# Patient Record
Sex: Male | Born: 1990 | Race: Black or African American | Hispanic: No | Marital: Single | Smoking: Never smoker
Health system: Southern US, Community
[De-identification: ages and names within clinical notes are randomized; demographics above are authoritative.]

---
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
## 2014-10-17 ENCOUNTER — Emergency Department (HOSPITAL_COMMUNITY)
Admission: EM | Admit: 2014-10-17 | Discharge: 2014-10-17 | Disposition: A | Discharge status: Home / Self Care | Payer: Self-pay | Attending: Emergency Medicine | Admitting: Emergency Medicine

## 2014-10-17 ENCOUNTER — Encounter (HOSPITAL_COMMUNITY): Payer: Self-pay | Admitting: *Deleted

## 2014-10-17 DIAGNOSIS — L259 Unspecified contact dermatitis, unspecified cause: Secondary | ICD-10-CM | POA: Insufficient documentation

## 2014-10-17 DIAGNOSIS — L309 Dermatitis, unspecified: Secondary | ICD-10-CM

## 2014-10-17 MED ORDER — DIPHENHYDRAMINE HCL 25 MG PO TABS: 25.0000 mg | ORAL_TABLET | Freq: Four times a day (QID) | ORAL | Status: DC

## 2014-10-17 MED ORDER — PREDNISONE 20 MG PO TABS
60.0000 mg | ORAL_TABLET | Freq: Every day | ORAL | Status: DC
  Administered 2014-10-17: 60 mg via ORAL
  Filled 2014-10-17: qty 3

## 2014-10-17 MED ORDER — DIPHENHYDRAMINE HCL 25 MG PO CAPS
25.0000 mg | ORAL_CAPSULE | Freq: Once | ORAL | Status: AC
  Administered 2014-10-17: 25 mg via ORAL
  Filled 2014-10-17: qty 1

## 2014-10-17 MED ORDER — PREDNISONE 10 MG (21) PO TBPK: 10.0000 mg | ORAL_TABLET | Freq: Every day | ORAL | Status: DC

## 2014-10-17 MED ORDER — FAMOTIDINE 20 MG PO TABS
20.0000 mg | ORAL_TABLET | Freq: Once | ORAL | Status: AC
  Administered 2014-10-17: 20 mg via ORAL
  Filled 2014-10-17: qty 1

## 2014-10-17 NOTE — Discharge Instructions (Signed)

## 2014-10-17 NOTE — ED Provider Notes (Signed)
CSN: [id number]     Arrival date & time 10/17/14  1613 History  This chart was scribed for Phillip Berry, PA-C, working with Laurence Spates, MD by Chestine Spore, ED Scribe. The patient was seen in room TR10C/TR10C at 5:05 PM.    Chief Complaint  Patient presents with  . Rash      The history Flores provided by the patient. No language interpreter was used.    Phillip Mcelroy Flores a 24 y.o. male who presents to the Emergency Department complaining of worsening rash onset 4 days. Pt reports that his rash was initially just on the palm of his hand and it has spread to the soles of his feet, back, and upper chest. Pt states that he had a rash to his face that has since resolved since using the OTC anti-itch cream. Pt reports that the areas burn, itch, and sting and this morning there was pain with ambulation. Pt rates his rash pain as 5/10 and he has been able to use his hands. 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 Pt denies coming in contact with chemicals at his job. Pt thinks that he Flores having a reaction to either a detergent or medication. Pt denies new soaps/medications/pets/environment/lotions/detergent. He notes that he has tried OTC anti-itch cream without PO medications with mild relief of his symptoms. He denies wound, diaphoresis, fever, chills, trouble swallowing, SOB, lip/tongue swelling, sore throat, and any other symptoms. Pt Flores not allergic to any medications. Pt denies allergies to any medications. Pt denies having a PCP at this time. Pt denies a medical hx of diabetes.   History reviewed. No pertinent past medical history. History reviewed. No pertinent past surgical history. No family history on file. History  Substance Use Topics  . Smoking status: Never Smoker   . Smokeless tobacco: Not on file  . Alcohol Use: No    Review of Systems  Constitutional: Negative for fever, chills and  diaphoresis.  HENT: Negative for sore throat and trouble swallowing.   Skin: Positive for color change and rash. Negative for wound.      Allergies  Review of patient's allergies indicates no known allergies.  Home Medications   Prior to Admission medications   Not on File   BP 136/95 mmHg  Pulse 94  Temp(Src) 97.9 F (36.6 C) (Oral)  Resp 18  SpO2 96% Physical Exam  Constitutional: He Flores oriented to person, place, and time. He appears well-developed and well-nourished. No distress.  HENT:  Head: Normocephalic and atraumatic.  Nose: Nose normal.  Mouth/Throat: Oropharynx Flores clear and moist. No oropharyngeal exudate.  Eyes: Conjunctivae and EOM are normal. Pupils are equal, round, and reactive to light. Right eye exhibits no discharge. Left eye exhibits no discharge. No scleral icterus.  Neck: Normal range of motion. No JVD present. No tracheal deviation present. No thyromegaly present.  Cardiovascular: Normal rate, regular rhythm, normal heart sounds and intact distal pulses.  Exam reveals no gallop and no friction rub.   No murmur heard. Pulmonary/Chest: Effort normal and breath sounds normal. No respiratory distress. He has no wheezes. He has no rales. He exhibits no tenderness.  Abdominal: Soft. Bowel sounds are normal. He exhibits no distension and no mass. There Flores no tenderness. There Flores no rebound and no guarding.  Musculoskeletal: Normal range of motion. He exhibits no edema or tenderness.  Lymphadenopathy:    He  has no cervical adenopathy.  Neurological: He Flores alert and oriented to person, place, and time. He has normal reflexes. No cranial nerve deficit. He exhibits normal muscle tone. Coordination normal.  Skin: Skin Flores warm and dry. Rash noted. Rash Flores maculopapular. He Flores not diaphoretic. There Flores erythema. No pallor.  Erythematous maculopapular rash to the palms of hands, in-between fingers 1, 2, and 3 bilaterally to the plantar aspect of feet and diffusely on  chest and back.  Psychiatric: He has a normal mood and affect. His behavior Flores normal. Judgment and thought content normal.  Nursing note and vitals reviewed.   ED Course  Procedures (including critical care time) DIAGNOSTIC STUDIES: Oxygen Saturation Flores 96% on RA, nl by my interpretation.    COORDINATION OF CARE: 5:11 PM-Discussed treatment plan which includes benadryl, pepcid, prednisone, f/u if the symptoms worsen, referral to health and wellness center with pt at bedside and pt agreed to plan.   Labs Review Labs Reviewed - No data to display  Imaging Review No results found.   EKG Interpretation None      MDM   Final diagnoses:  None    Rash/allergic reaction - pt suspects it may be to pills his friend gave him or some exposure at work - rash to hands, feet, back and chest - erythematous macules/wheels, no respiratory distress PT given pepcid, benadryl and steroids - Rx of benadryl for itch and long steroid taper. Patient re-evaluated prior to dc, Flores hemodynamically stable, in no respiratory distress, and denies the feeling of throat closing. Pt has been advised to take OTC benadryl & return to the ED if they have a mod-severe allergic rxn (s/s including throat closing, difficulty breathing, swelling of lips face or tongue). Pt Flores to follow up with referall to Health and Wellness center. Pt Flores agreeable with plan & verbalizes understanding.   I personally performed the services described in this documentation, which was scribed in my presence. The recorded information has been reviewed and Flores accurate.    Phillip Berry, PA-C 10/25/14 0127  Laurence Spates, MD 10/27/14 [id number]

## 2014-10-17 NOTE — ED Notes (Signed)
Pt states that he has a reaction to some detergent or medication on Monday. Pt reports a rash at this time. Pt denies any other symptoms. NAD noted.

## 2014-10-22 ENCOUNTER — Emergency Department (HOSPITAL_COMMUNITY)
Admission: EM | Admit: 2014-10-22 | Discharge: 2014-10-22 | Disposition: A | Discharge status: Home / Self Care | Payer: Self-pay | Attending: Emergency Medicine | Admitting: Emergency Medicine

## 2014-10-22 ENCOUNTER — Encounter (HOSPITAL_COMMUNITY): Payer: Self-pay

## 2014-10-22 DIAGNOSIS — R21 Rash and other nonspecific skin eruption: Secondary | ICD-10-CM | POA: Insufficient documentation

## 2014-10-22 MED ORDER — HYDROCORTISONE 1 % EX CREA: 1.0000 "application " | TOPICAL_CREAM | Freq: Two times a day (BID) | CUTANEOUS | Status: DC

## 2014-10-22 NOTE — Discharge Instructions (Signed)
Take the prescribed medication as directed.  Continue taking prednisone as directed. Return to the ED for new or worsening symptoms.

## 2014-10-22 NOTE — ED Provider Notes (Signed)
CSN: [id number]     Arrival date & time 10/22/14  1359 History  This chart was scribed for non-physician practitioner, Sharilyn Sites, PA-C, working with Gwyneth Sprout, MD by Charline Bills, ED Scribe. This patient was seen in room TR07C/TR07C and the patient's care was started at 2:44 PM.   Chief Complaint  Patient presents with  . Medication Problem   The history is provided by the patient. No language interpreter was used.   HPI Comments: Phillip Flores is a 24 y.o. male who presents to the Emergency Department complaining of bilateral feet swelling since last night. Pt was seen in the ED on 10/17/14 for a rash and prescribed Prednisone. He states that he has taken Prednisone daily at 6 AM but forgot to take the medication until 10 PM last night. Patient states he noticed his feet were swelling prior to getting in bed last night. He states he does stand on his feet for long periods of day and walks around a lot. He denies any numbness or weakness of his feet. No worsening of rash. No shortness of breath, oral swelling, or difficulty swallowing.  History reviewed. No pertinent past medical history. History reviewed. No pertinent past surgical history. History reviewed. No pertinent family history. History  Substance Use Topics  . Smoking status: Never Smoker   . Smokeless tobacco: Not on file  . Alcohol Use: No    Review of Systems  Skin: Positive for rash.  All other systems reviewed and are negative.  Allergies  Review of patient's allergies indicates no known allergies.  Home Medications   Prior to Admission medications   Medication Sig Start Date End Date Taking? Authorizing Provider  diphenhydrAMINE (BENADRYL) 25 MG tablet Take 1 tablet (25 mg total) by mouth every 6 (six) hours. 10/17/14   Danelle Berry, PA-C  predniSONE (STERAPRED UNI-PAK 21 TAB) 10 MG (21) TBPK tablet Take 1 tablet (10 mg total) by mouth daily. Take 6 tabs by mouth daily  for 2 days, then 5 tabs for 2 days, then 4  tabs for 2 days, then 3 tabs for 2 days, 2 tabs for 2 days, then 1 tab by mouth daily for 2 days 10/17/14   Sheliah Mends Tapia, PA-C   BP 130/69 mmHg  Pulse 104  Temp(Src) 97.9 F (36.6 C) (Oral)  Resp 20  Ht 5\' 11"  (1.803 m)  Wt 264 lb 12.8 oz (120.112 kg)  BMI 36.95 kg/m2  SpO2 97% Physical Exam  Constitutional: He is oriented to person, place, and time. He appears well-developed and well-nourished. No distress.  HENT:  Head: Normocephalic and atraumatic.  Mouth/Throat: Oropharynx is clear and moist.  Eyes: Conjunctivae and EOM are normal. Pupils are equal, round, and reactive to light.  Neck: Normal range of motion. Neck supple.  Cardiovascular: Normal rate, regular rhythm and normal heart sounds.   Pulmonary/Chest: Effort normal and breath sounds normal. No respiratory distress. He has no wheezes.  Musculoskeletal: Normal range of motion.  Neurological: He is alert and oriented to person, place, and time.  Skin: Skin is warm and dry. He is not diaphoretic.  Appears to have mild skin irritation of bilateral arches of feet without overt rash at this time; no signs of superimposed infection or cellulitis; no open wounds or drainage; no foot swelling noted, patient ambulatory without difficulty  Psychiatric: He has a normal mood and affect.  Nursing note and vitals reviewed.  ED Course  Procedures (including critical care time) DIAGNOSTIC STUDIES: Oxygen Saturation is 97% on  RA, normal by my interpretation.    COORDINATION OF CARE: 2:48 PM-Discussed treatment plan which includes Hydrocortisone cream with pt at bedside and pt agreed to plan.   Labs Review Labs Reviewed - No data to display  Imaging Review No results found.   EKG Interpretation None      MDM   Final diagnoses:  Rash   24 year old male here with bilateral foot swelling. I do not believe this is related to his rash nor his use of prednisone, this is more likely dependent edema from walking around for 8+ hours at  work. There is no visible swelling of his feet at this time. He continues to have some mild irritation of bilateral arches of his feet without overt rash. No signs of superimposed infection or cellulitis. He is ambulatory without difficulty. Will add topical hydrocortisone cream to regimen. Patient to follow-up with PCP.  Discussed plan with patient, he/she acknowledged understanding and agreed with plan of care.  Return precautions given for new or worsening symptoms.  I personally performed the services described in this documentation, which was scribed in my presence. The recorded information has been reviewed and is accurate.  Garlon Hatchet, PA-C 10/22/14 1511  Gwyneth Sprout, MD 10/28/14 2117

## 2014-10-22 NOTE — ED Notes (Signed)
Pt reports onset 1 week ago had widespread swelling and rash.  Seen in ED was given steroids.  Rash and swelling resolved.  Pt has been taking the prednisone at 6am but yesterday did not take until 10pm.  Pt woke up this morning and had feet swelling that has resolved and took dose @ 4:30am.  No swallowing or respiratory difficulties.

## 2014-10-29 ENCOUNTER — Encounter (HOSPITAL_COMMUNITY): Payer: Self-pay | Admitting: *Deleted

## 2014-10-29 ENCOUNTER — Emergency Department (HOSPITAL_COMMUNITY)
Admission: EM | Admit: 2014-10-29 | Discharge: 2014-10-29 | Disposition: A | Discharge status: Home / Self Care | Payer: Self-pay | Attending: Emergency Medicine | Admitting: Emergency Medicine

## 2014-10-29 DIAGNOSIS — L509 Urticaria, unspecified: Secondary | ICD-10-CM

## 2014-10-29 DIAGNOSIS — L500 Allergic urticaria: Secondary | ICD-10-CM | POA: Insufficient documentation

## 2014-10-29 MED ORDER — PREDNISONE 10 MG (21) PO TBPK: 10.0000 mg | ORAL_TABLET | Freq: Every day | ORAL | Status: AC

## 2014-10-29 MED ORDER — DIPHENHYDRAMINE HCL 25 MG PO CAPS: 25.0000 mg | ORAL_CAPSULE | Freq: Three times a day (TID) | ORAL | Status: AC | PRN

## 2014-10-29 MED ORDER — DIPHENHYDRAMINE HCL 25 MG PO CAPS
25.0000 mg | ORAL_CAPSULE | Freq: Once | ORAL | Status: AC
  Administered 2014-10-29: 25 mg via ORAL
  Filled 2014-10-29: qty 1

## 2014-10-29 NOTE — ED Notes (Signed)
Called for triage x3. No answer 

## 2014-10-29 NOTE — ED Provider Notes (Signed)
CSN: [id number]     Arrival date & time 10/29/14  1829 History  This chart was scribed for Phillip Favor, NP working with Phillip Scott, MD by Placido Sou, ED Scribe. This patient was seen in room TR02C/TR02C and the patient's care was started at 7:59 PM.     Chief Complaint  Patient presents with  . Allergic Reaction   The history is provided by the patient. No language interpreter was used.   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 He initially notes being seen for his symptoms, was prescribed prednisone and hydrocortisone cream and further notes that it alleviated his symptoms partially before they worsened once again. 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 He denies fever, chills or any other associated symptoms.  He states prior to the onset of the rash 3 weeks ago.  He was not on any antibiotics, new medications or new  products, etc.  History reviewed. No pertinent past medical history. History reviewed. No pertinent past surgical history. No family history on file. Social History  Substance Use Topics  . Smoking status: Never Smoker   . Smokeless tobacco: Never Used  . Alcohol Use: No    Review of Systems  Constitutional: Negative for fever and chills.  HENT: Negative for trouble swallowing and voice change.   Respiratory: Negative for shortness of breath.   Gastrointestinal: Negative for nausea.  Skin: Positive for rash.  Neurological: Negative for dizziness and headaches.  All other systems reviewed and are negative.   Allergies  Review of patient's allergies indicates no known allergies.  Home Medications   Prior to Admission medications   Medication Sig Start Date End Date Taking? Authorizing Provider  diphenhydrAMINE (BENADRYL) 25 mg capsule Take 1 capsule (25 mg total) by  mouth every 8 (eight) hours as needed. 10/29/14   Phillip Favor, NP  predniSONE (STERAPRED UNI-PAK 21 TAB) 10 MG (21) TBPK tablet Take 1 tablet (10 mg total) by mouth daily. Take 6 tabs by mouth daily  for 2 days, then 5 tabs for 2 days, then 4 tabs for 2 days, then 3 tabs for 2 days, 2 tabs for 2 days, then 1 tab by mouth daily for 2 days 10/29/14   Phillip Favor, NP   BP 136/90 mmHg  Pulse 107  Temp(Src) 98.3 F (36.8 C) (Oral)  Resp 18  Ht  (1.803 m)  Wt 264 lb (119.75 kg)  BMI 36.84 kg/m2  SpO2 98% Physical Exam  Constitutional: He is oriented to person, place, and time. He appears well-developed and well-nourished. No distress.  HENT:  Head: Normocephalic and atraumatic.  Mouth/Throat: No oropharyngeal exudate.  Eyes: Right eye exhibits no discharge. Left eye exhibits no discharge.  Neck: Normal range of motion. No tracheal deviation present.  Cardiovascular: Normal rate.   Pulmonary/Chest: Effort normal. No respiratory distress.  Abdominal: Soft. There is no tenderness.  Musculoskeletal: Normal range of motion.  Neurological: He is alert and oriented to person, place, and time.  Skin: Skin is warm and dry. Rash noted. He is not diaphoretic.  Patient has an urticarial type rash that blanches with pressure.    Psychiatric: He has a normal mood and affect. His behavior is normal.  Nursing note and vitals reviewed.   ED Course  Procedures  DIAGNOSTIC STUDIES:  Oxygen Saturation is 98% on RA, normal by my interpretation.    COORDINATION OF CARE: 8:01 PM Discussed treatment plan with pt at bedside and pt agreed to plan.  Labs Review Labs Reviewed - No data to display  Imaging Review No results found. I have personally reviewed and evaluated these images and lab results as part of my medical decision-making.   EKG Interpretation None     Patient is not having any respiratory distress but has an occurrence of urticaria, idiopathic in nature.  He will be given another  short course of steroid-dependent and a referral to dermatologist MDM   Final diagnoses:  Urticaria     I personally performed the services described in this documentation, which was scribed in my presence. The recorded information has been reviewed and is accurate.  Phillip Favor, NP 10/29/14 8546  Phillip Scott, MD 10/29/14 2027

## 2014-10-29 NOTE — ED Notes (Signed)
Patient presents with red raised areas to his arms and neck and face.  States he finished up his meds today about noon and the rash started coming back  Was treated for the same  Works in a warehouse

## 2014-10-29 NOTE — ED Notes (Signed)
The pt was taken out of the computer ?? Placed back into the system

## 2018-05-19 ENCOUNTER — Emergency Department (HOSPITAL_COMMUNITY)
Admission: EM | Admit: 2018-05-19 | Discharge: 2018-05-19 | Disposition: A | Discharge status: Home / Self Care | Payer: Self-pay | Attending: Emergency Medicine | Admitting: Emergency Medicine

## 2018-05-19 ENCOUNTER — Encounter (HOSPITAL_COMMUNITY): Payer: Self-pay | Admitting: Emergency Medicine

## 2018-05-19 ENCOUNTER — Other Ambulatory Visit: Payer: Self-pay

## 2018-05-19 ENCOUNTER — Emergency Department (HOSPITAL_COMMUNITY): Payer: Self-pay

## 2018-05-19 DIAGNOSIS — R109 Unspecified abdominal pain: Secondary | ICD-10-CM

## 2018-05-19 DIAGNOSIS — N132 Hydronephrosis with renal and ureteral calculous obstruction: Secondary | ICD-10-CM

## 2018-05-19 DIAGNOSIS — N131 Hydronephrosis with ureteral stricture, not elsewhere classified: Secondary | ICD-10-CM | POA: Insufficient documentation

## 2018-05-19 LAB — CBC WITH DIFFERENTIAL/PLATELET
ABS IMMATURE GRANULOCYTES: 0.01 10*3/uL (ref 0.00–0.07)
Basophils Absolute: 0 10*3/uL (ref 0.0–0.1)
Basophils Relative: 0 %
Eosinophils Absolute: 0.1 10*3/uL (ref 0.0–0.5)
Eosinophils Relative: 2 %
HEMATOCRIT: 47.2 % (ref 39.0–52.0)
Hemoglobin: 15.9 g/dL (ref 13.0–17.0)
IMMATURE GRANULOCYTES: 0 %
LYMPHS ABS: 2.5 10*3/uL (ref 0.7–4.0)
Lymphocytes Relative: 37 %
MCH: 29.8 pg (ref 26.0–34.0)
MCHC: 33.7 g/dL (ref 30.0–36.0)
MCV: 88.6 fL (ref 80.0–100.0)
MONO ABS: 0.5 10*3/uL (ref 0.1–1.0)
MONOS PCT: 8 %
NEUTROS ABS: 3.5 10*3/uL (ref 1.7–7.7)
NEUTROS PCT: 53 %
Platelets: 309 10*3/uL (ref 150–400)
RBC: 5.33 MIL/uL (ref 4.22–5.81)
RDW: 12.8 % (ref 11.5–15.5)
WBC: 6.6 10*3/uL (ref 4.0–10.5)
nRBC: 0 % (ref 0.0–0.2)

## 2018-05-19 LAB — URINALYSIS, ROUTINE W REFLEX MICROSCOPIC
Bilirubin Urine: NEGATIVE
GLUCOSE, UA: NEGATIVE mg/dL
Ketones, ur: NEGATIVE mg/dL
Leukocytes,Ua: NEGATIVE
Nitrite: NEGATIVE
Protein, ur: NEGATIVE mg/dL
SPECIFIC GRAVITY, URINE: 1.013 (ref 1.005–1.030)
pH: 7 (ref 5.0–8.0)

## 2018-05-19 LAB — BASIC METABOLIC PANEL
ANION GAP: 10 (ref 5–15)
BUN: 12 mg/dL (ref 6–20)
CALCIUM: 9.9 mg/dL (ref 8.9–10.3)
CO2: 24 mmol/L (ref 22–32)
Chloride: 107 mmol/L (ref 98–111)
Creatinine, Ser: 1.22 mg/dL (ref 0.61–1.24)
GFR calc Af Amer: >60 mL/min (ref >60)
GFR calc non Af Amer: >60 mL/min (ref >60)
GLUCOSE: 100 mg/dL — AB (ref 70–99)
Potassium: 3.9 mmol/L (ref 3.5–5.1)
Sodium: 141 mmol/L (ref 135–145)

## 2018-05-19 MED ORDER — ONDANSETRON 4 MG PO TBDP: ORAL_TABLET | ORAL | 0 refills | Status: DC

## 2018-05-19 MED ORDER — HYDROCODONE-ACETAMINOPHEN 5-325 MG PO TABS: 1.0000 | ORAL_TABLET | Freq: Four times a day (QID) | ORAL | 0 refills | Status: DC | PRN

## 2018-05-19 MED ORDER — ACETAMINOPHEN 325 MG PO TABS
650.0000 mg | ORAL_TABLET | Freq: Once | ORAL | Status: AC
  Administered 2018-05-19: 650 mg via ORAL
  Filled 2018-05-19: qty 2

## 2018-05-19 MED ORDER — TAMSULOSIN HCL 0.4 MG PO CAPS: 0.4000 mg | ORAL_CAPSULE | Freq: Every day | ORAL | 0 refills | Status: DC

## 2018-05-19 MED ORDER — IBUPROFEN 600 MG PO TABS: 600.0000 mg | ORAL_TABLET | Freq: Four times a day (QID) | ORAL | 0 refills | Status: AC | PRN

## 2018-05-19 NOTE — ED Triage Notes (Signed)
Patient complains of lower left abdominal pain. This pain was present a few weeks ago for a few hours but the pain went away. The pain started at 1000 and has been constant.

## 2018-05-19 NOTE — ED Provider Notes (Signed)
[facility] EMERGENCY DEPARTMENT Provider Note   CSN: [id number] Arrival date & time: 05/19/18  1704    History   Chief Complaint Chief Complaint  Patient presents with  . Abdominal Pain    HPI Phillip Flores is a 28 y.o. male.     The history is provided by the patient. No language interpreter was used.  Abdominal Pain   Phillip Flores is a 28 y.o. male who presents to the Emergency Department complaining of abdominal pain. He presents to the emergency department complaining of abdominal pain. He had a pain episode about two weeks ago in the left lower quadrant that resolved without intervention. Today around 6 AM he developed recurrent left lower quadrant pain that initially was 8/10 in severity. Overall the pain has significantly improved without intervention. It is dull in nature and nonradiating. He denies any fevers, nausea, vomiting, dysuria, hematuria, diarrhea, constipation, testicular/groin swelling or pain. He works as a Hospital doctor for [employer]. He denies any recent injuries. No prior similar symptoms. History reviewed. No pertinent past medical history.  There are no active problems to display for this patient.   History reviewed. No pertinent surgical history.      Home Medications    Prior to Admission medications   Medication Sig Start Date End Date Taking? Authorizing Provider  pseudoephedrine (SUDAFED) 30 MG tablet Take 30 mg by mouth every 8 (eight) hours as needed for congestion.   Yes [provider]  diphenhydrAMINE (BENADRYL) 25 mg capsule Take 1 capsule (25 mg total) by mouth every 8 (eight) hours as needed. Patient not taking: Reported on 05/19/2018 10/29/14   Earley Favor, NP  predniSONE (STERAPRED UNI-PAK 21 TAB) 10 MG (21) TBPK tablet Take 1 tablet (10 mg total) by mouth daily. Take 6 tabs by mouth daily  for 2 days, then 5 tabs for 2 days, then 4 tabs for 2 days, then 3 tabs for 2 days, 2 tabs for 2 days, then 1 tab by mouth daily for 2  days Patient not taking: Reported on 05/19/2018 10/29/14   Earley Favor, NP    Family History History reviewed. No pertinent family history.  Social History Social History   Tobacco Use  . Smoking status: Never Smoker  . Smokeless tobacco: Never Used  Substance Use Topics  . Alcohol use: No  . Drug use: No     Allergies   Patient has no known allergies.   Review of Systems Review of Systems  Gastrointestinal: Positive for abdominal pain.  All other systems reviewed and are negative.    Physical Exam Updated Vital Signs BP 135/78 (BP Location: Right Arm)   Pulse 73   Temp 98.4 F (36.9 C) (Oral)   Resp 16   Ht 5\' 10"  (1.778 m)   Wt 113.4 kg   SpO2 96%   BMI 35.87 kg/m   Physical Exam Vitals signs and nursing note reviewed.  Constitutional:      Appearance: He is well-developed.  HENT:     Head: Normocephalic and atraumatic.  Cardiovascular:     Rate and Rhythm: Normal rate and regular rhythm.     Heart sounds: No murmur.  Pulmonary:     Effort: Pulmonary effort is normal. No respiratory distress.     Breath sounds: Normal breath sounds.  Abdominal:     Palpations: Abdomen is soft.     Tenderness: There is no abdominal tenderness. There is no guarding or rebound.  Musculoskeletal:  General: No tenderness.  Skin:    General: Skin is warm and dry.  Neurological:     Mental Status: He is alert and oriented to person, place, and time.  Psychiatric:        Behavior: Behavior normal.      ED Treatments / Results  Labs (all labs ordered are listed, but only abnormal results are displayed) Labs Reviewed  URINALYSIS, ROUTINE W REFLEX MICROSCOPIC    EKG None  Radiology No results found.  Procedures Procedures (including critical care time)  Medications Ordered in ED Medications  acetaminophen (TYLENOL) tablet 650 mg (has no administration in time range)     Initial Impression / Assessment and Plan / ED Course  I have reviewed the  triage vital signs and the nursing notes.  Pertinent labs & imaging results that were available during my care of the patient were reviewed by me and considered in my medical decision making (see chart for details).        Patient here for evaluation of atraumatic left lower quadrant pain. He has no significant tenderness or masses on examination. UA was significant hematuria, plan to obtain BMP as well as CT scan studies to rule out obstructing stone. Patient care transferred pending CT. Final Clinical Impressions(s) / ED Diagnoses   Final diagnoses:  None    ED Discharge Orders    None       Tilden Fossa, MD 05/19/18 Silva Bandy

## 2018-05-19 NOTE — ED Notes (Signed)
Patient transported to CT 

## 2018-05-19 NOTE — ED Provider Notes (Signed)
Care assumed from Dr. Madilyn Hook, please see her note for full details, but in brief Phillip Flores is a 28 y.o. male who presents for evaluation of left lower quadrant pain.  Pain well managed here in the ED with Tylenol.  Blood noted in patient's urine and presentation concerning for kidney stone.  At shift change, CBC, BMP and CT renal stone study pending.  Plan: Follow-up on CT and labs, likely kidney stone, no signs of infection on urine.  Patient can likely be discharged home with symptom management.  Labs Reviewed  URINALYSIS, ROUTINE W REFLEX MICROSCOPIC - Abnormal; Notable for the following components:      Result Value   APPearance CLOUDY (*)    Hgb urine dipstick MODERATE (*)    Bacteria, UA RARE (*)    All other components within normal limits  BASIC METABOLIC PANEL - Abnormal; Notable for the following components:   Glucose, Bld 100 (*)    All other components within normal limits  CBC WITH DIFFERENTIAL/PLATELET   Ct Renal Stone Study  Result Date: 05/19/2018 CLINICAL DATA:  Intermittent left flank pain today and 1 week ago. EXAM: CT ABDOMEN AND PELVIS WITHOUT CONTRAST TECHNIQUE: Multidetector CT imaging of the abdomen and pelvis was performed following the standard protocol without IV contrast. COMPARISON:  None. FINDINGS: Lower chest: The lung bases are clear without focal nodule, mass, or airspace disease. The heart size is normal. Hepatobiliary: No focal liver abnormality is seen. No gallstones, gallbladder wall thickening, or biliary dilatation. Pancreas: Unremarkable. No pancreatic ductal dilatation or surrounding inflammatory changes. Spleen: Normal in size without focal abnormality. Adrenals/Urinary Tract: Adrenal glands are within normal limits bilaterally. Right kidney and ureter is normal. Mild left sided hydronephrosis is present. A punctate nonobstructing stone is present at the lower pole of the left kidney. The left ureter is dilated to an obstructing 3 mm stone at the left UVJ.  The urinary bladder is otherwise normal. Stomach/Bowel: The stomach is mildly distended. No obstructing lesion is present. The duodenum and small bowel are normal. Terminal ileum is within normal limits. The appendix is visualized and normal. The ascending and transverse colon are within normal limits. The descending and sigmoid colon are normal. Vascular/Lymphatic: No significant vascular findings are present. No enlarged abdominal or pelvic lymph nodes. Reproductive: Prostate is unremarkable. Other: No abdominal wall hernia or abnormality. No abdominopelvic ascites. Musculoskeletal: Vertebral body heights and alignment are maintained. Pelvis is normal. Hips are located and normal bilaterally. IMPRESSION: 1. Obstructing 3 mm stone in the distal left ureter at the UVJ with mild left-sided hydronephrosis. 2. Punctate nonobstructing stone at the lower pole of the left kidney. Electronically Signed   By: Marin Roberts M.D.   On: 05/19/2018 19:39   MDM  Labs showed normal kidney function, no acute electrolyte derangements, no leukocytosis and normal hemoglobin.  CT shows a 3 mm obstructing stone at the left UVJ with mild left-sided hydro-.  There is a punctate nonobstructing stone in the left lower pole of the kidney.  Discussed these results with patient, his pain is been well controlled here in the emergency department, will discharge home with pain medication, Zofran and Flomax, he was provided a urine strainer, urology follow-up if stone not passing her symptoms worsen.  Return precautions discussed.  Patient and mom expressed understanding and are in agreement with plan.   Legrand Rams 05/19/18 2022    Tilden Fossa, MD 05/20/18 [phone]

## 2018-05-19 NOTE — Discharge Instructions (Addendum)
Take ibuprofen if pain not managed with this you may use Norco.  Zofran as needed for nausea.  Flomax daily to help ease passing of kidney stone.  Follow-up with urology if stone is not passing.  Return if you have worsening flank pain, persistent vomiting, fevers or any other new or concerning symptoms.

## 2018-05-19 NOTE — ED Notes (Signed)
Patient verbalized understanding of discharge instructions. Opportunities for questioning and answers were provided. Armband removed by staff. Patient removed from ED.   

## 2019-11-26 ENCOUNTER — Encounter (HOSPITAL_COMMUNITY): Payer: Self-pay | Admitting: Emergency Medicine

## 2019-11-26 ENCOUNTER — Emergency Department (HOSPITAL_COMMUNITY)
Admission: EM | Admit: 2019-11-26 | Discharge: 2019-11-26 | Disposition: A | Discharge status: Home / Self Care | Payer: Self-pay | Attending: Emergency Medicine | Admitting: Emergency Medicine

## 2019-11-26 DIAGNOSIS — Z20822 Contact with and (suspected) exposure to covid-19: Secondary | ICD-10-CM | POA: Insufficient documentation

## 2019-11-26 DIAGNOSIS — R509 Fever, unspecified: Secondary | ICD-10-CM | POA: Insufficient documentation

## 2019-11-26 LAB — SARS CORONAVIRUS 2 BY RT PCR (HOSPITAL ORDER, PERFORMED IN ~~LOC~~ HOSPITAL LAB): SARS Coronavirus 2: NEGATIVE

## 2019-11-26 NOTE — Discharge Instructions (Signed)
You were seen in the emergency department today with fever 3 days ago.  I am testing you for COVID-19 and you should remain in quarantine until your test results come back later today.  If your Covid test is negative then you can return to work.  You should activate your MyChart account to follow the test results which will be back later this afternoon.

## 2019-11-26 NOTE — ED Provider Notes (Signed)
Emergency Department Provider Note   I have reviewed the triage vital signs and the nursing notes.   HISTORY  Chief Complaint work note   HPI Phillip Flores is a 28 y.o. male presents to the emergency department for evaluation for return to work.  He developed a fever on Friday, now 3 days ago.  He states that the time is feeling very fatigued and subjectively warm so took his temp and it was greater than 100 F on his thermometer.  He was not experiencing cough or congestion.  No sore throat.  No diarrhea.  He was not having body aches, vomiting.  Denies any chest or abdominal pain.  No sick contacts.  He has had his first Covid vaccine but that was approximately 1 week prior to his fever.  He is scheduled for his second vaccination. No radiation of symptoms or modifying factors.   History reviewed. No pertinent past medical history.  There are no problems to display for this patient.   No past surgical history on file.  Allergies Patient has no known allergies.  No family history on file.  Social History Social History   Tobacco Use  . Smoking status: Never Smoker  . Smokeless tobacco: Never Used  Substance Use Topics  . Alcohol use: No  . Drug use: No    Review of Systems  Constitutional: Positive fever on Friday (resolved).  ENT: No sore throat. Cardiovascular: Denies chest pain. Respiratory: Denies shortness of breath. Gastrointestinal: No abdominal pain.  No nausea, no vomiting.  No diarrhea.  No constipation. Musculoskeletal: Negative for back pain. Skin: Negative for rash. Neurological: Negative for headaches, focal weakness or numbness.  10-point ROS otherwise negative.  ____________________________________________   PHYSICAL EXAM:  VITAL SIGNS: ED Triage Vitals [11/26/19 1434]  Enc Vitals Group     BP (!) 144/83     Pulse Rate 68     Resp 18     Temp 98.5 F (36.9 C)     Temp Source Oral     SpO2 98 %     Weight 250 lb (113.4 kg)      Height 5\' 10"  (1.778 m)    Constitutional: Alert and oriented. Well appearing and in no acute distress. Eyes: Conjunctivae are normal.  Head: Atraumatic. Nose: No congestion/rhinnorhea. Mouth/Throat: Mucous membranes are moist.   Neck: No stridor.   Cardiovascular: Normal rate, regular rhythm. Good peripheral circulation. Grossly normal heart sounds.   Respiratory: Normal respiratory effort.  No retractions. Lungs CTAB. Gastrointestinal: No distention.  Musculoskeletal: No gross deformities of extremities. Neurologic:  Normal speech and language. Skin:  Skin is warm, dry and intact. No rash noted.  ____________________________________________   LABS (all labs ordered are listed, but only abnormal results are displayed)  Labs Reviewed  SARS CORONAVIRUS 2 BY RT PCR (HOSPITAL ORDER, PERFORMED IN [facility])   ____________________________________________   PROCEDURES  Procedure(s) performed:   Procedures  None  ____________________________________________   INITIAL IMPRESSION / ASSESSMENT AND PLAN / ED COURSE  Pertinent labs & imaging results that were available during my care of the patient were reviewed by me and considered in my medical decision making (see chart for details).    Patient presents to the emergency department for evaluation for return to work.  He has been without symptoms since Friday when he last had a fever.  His exam is unremarkable.  Plan for Covid PCR here.  Patient has downloaded to the MyChart app and will follow the  test results there.  If negative, he is cleared to return to work and I will provide a note to this effect.    ____________________________________________  FINAL CLINICAL IMPRESSION(S) / ED DIAGNOSES  Final diagnoses:  Fever, unspecified fever cause    Note:  This document was prepared using Dragon voice recognition software and may include unintentional dictation errors.  Alona Bene, MD, [facility]  [facility]    [name], Arlyss Repress, MD 11/26/19 [phone]

## 2019-11-26 NOTE — ED Triage Notes (Signed)
Pt states Friday he had a fever, pt states since he has not had any fevers no complaints, states his work told him he needs a note to come back.

## 2020-02-21 IMAGING — CT CT RENAL STONE PROTOCOL
2 of 4 series · 16 of 46 positions shown, 18 images · non-contrast
Comparison: None.

CLINICAL DATA: Intermittent left flank pain today and 1 week ago.

EXAM:
CT ABDOMEN AND PELVIS WITHOUT CONTRAST
TECHNIQUE: Multidetector CT imaging of the abdomen and pelvis was performed
following the standard protocol without IV contrast.

[Series 4: renal stone 5.0 · axial · 0.77mm/px · z∈[-151,+309]mm · 13 of 101 slices shown, 15 images]
[im 5/101  soft-tissue]
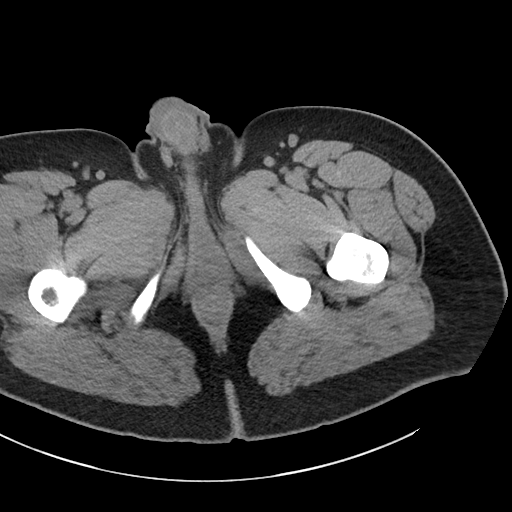
[im 5/101  bone]
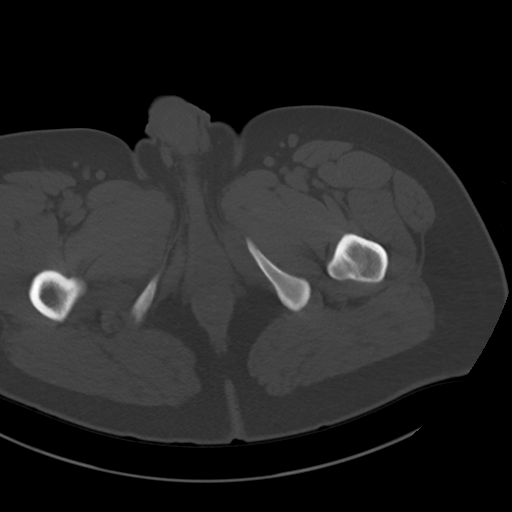
[im 13/101  soft-tissue]
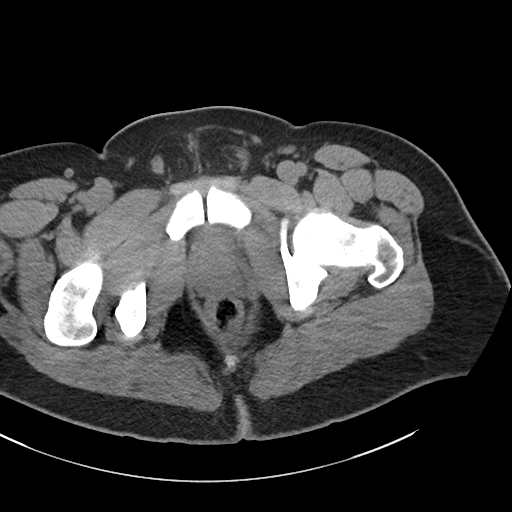
[im 21/101  soft-tissue]
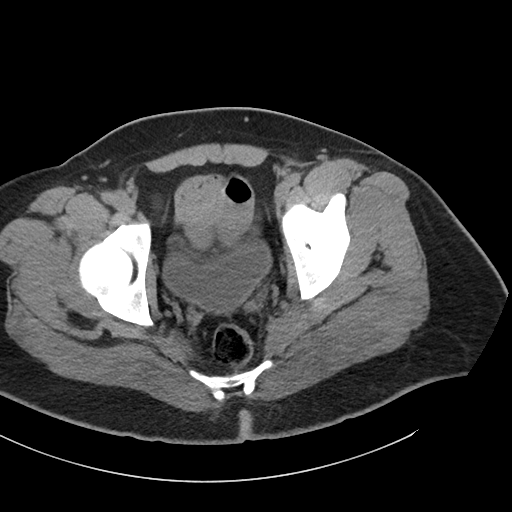
[im 29/101  soft-tissue]
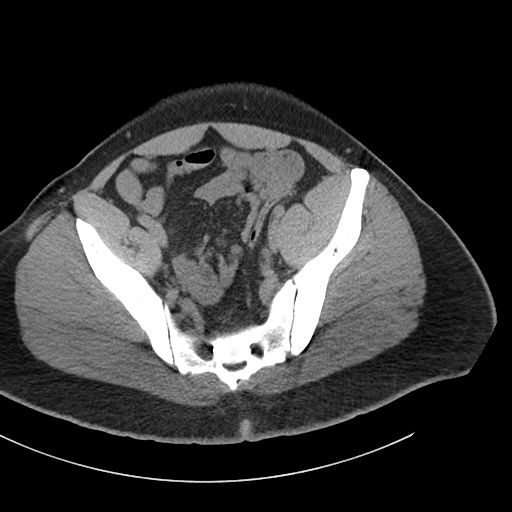
[im 37/101  soft-tissue]
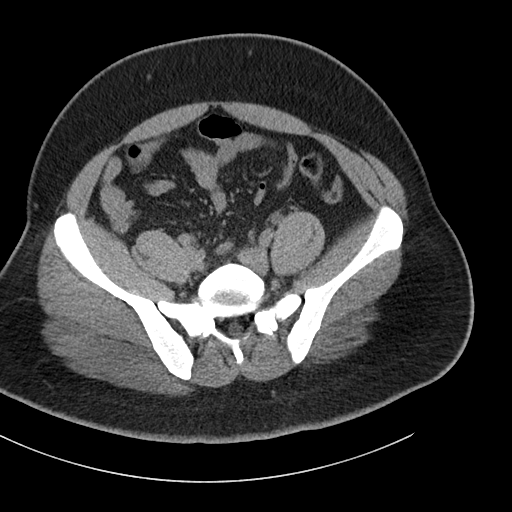
[im 45/101  soft-tissue]
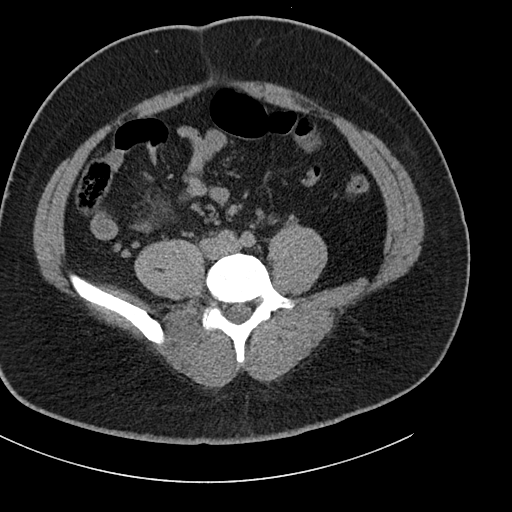
[im 53/101  soft-tissue]
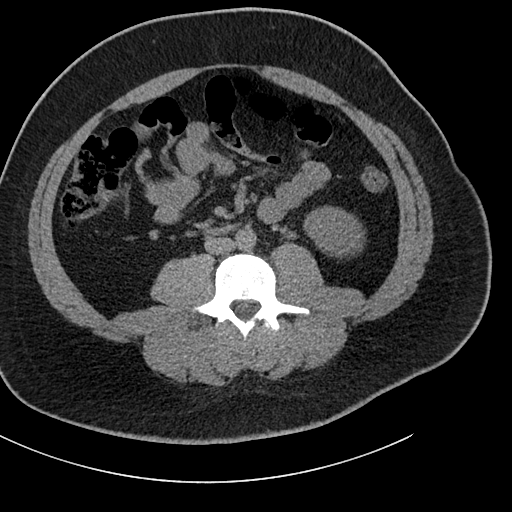
[im 57/101  soft-tissue]
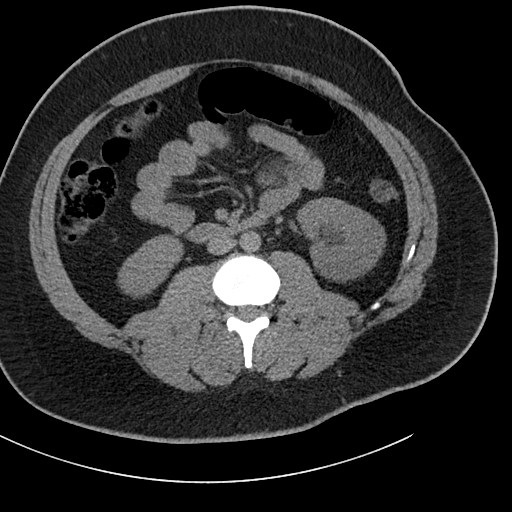
[im 65/101  soft-tissue]
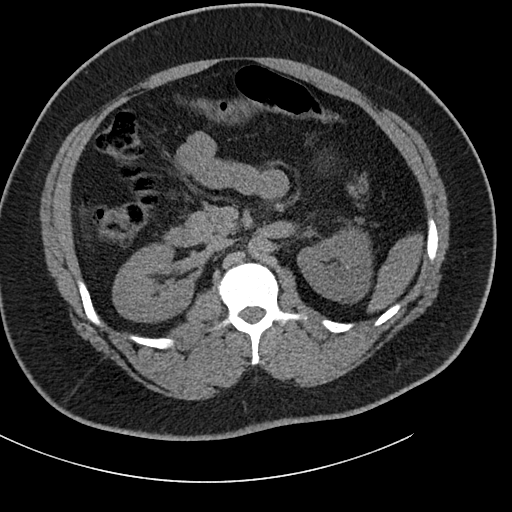
[im 65/101  bone]
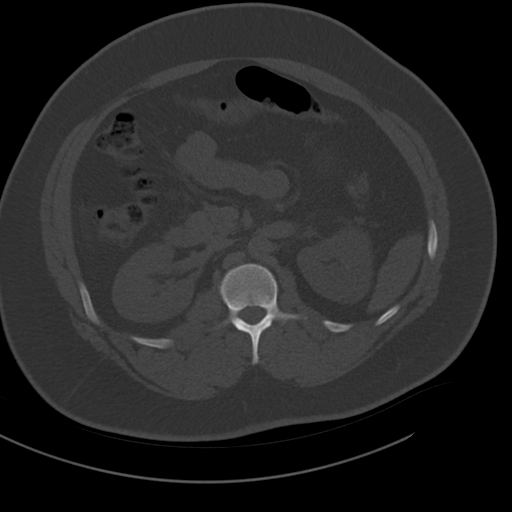
[im 73/101  soft-tissue]
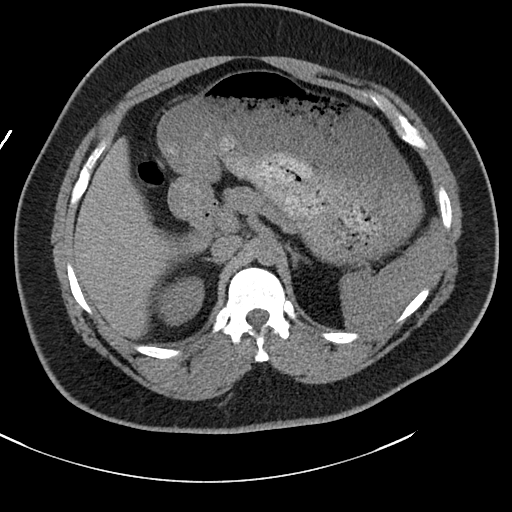
[im 81/101  soft-tissue]
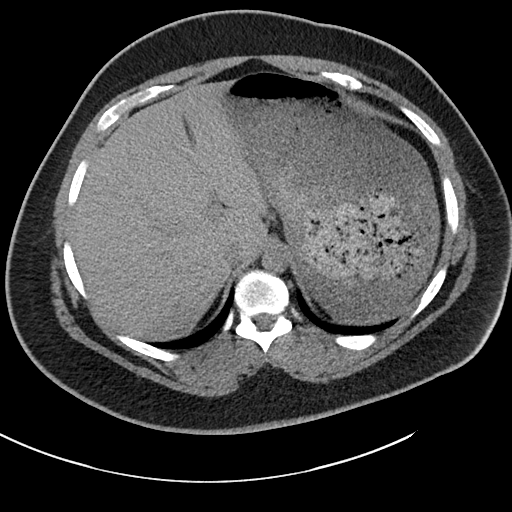
[im 89/101  soft-tissue]
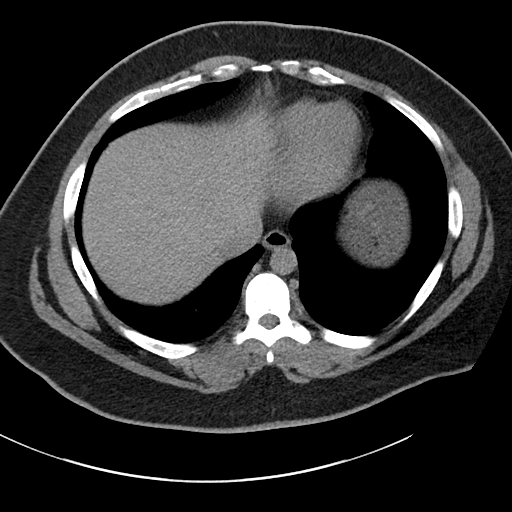
[im 97/101  soft-tissue]
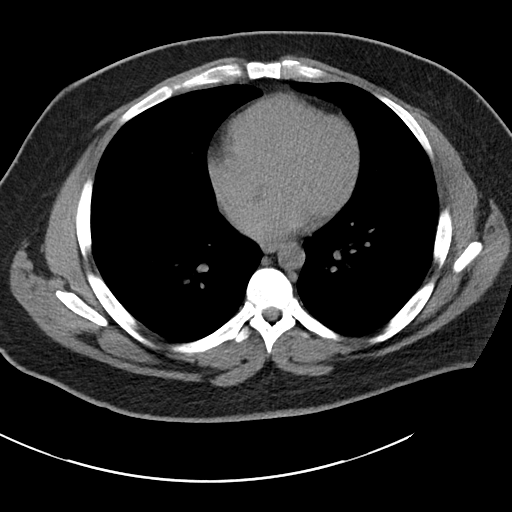

[Series 7: renal stone 3.0 cor · coronal · 0.93mm/px · 3 of 117 slices shown]
[im 39/117  soft-tissue]
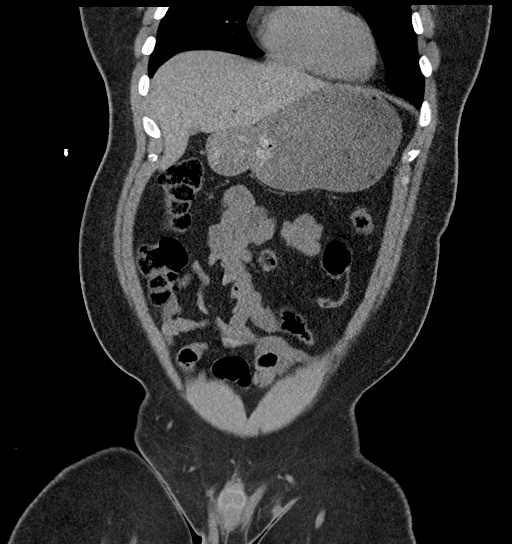
[im 52/117  soft-tissue]
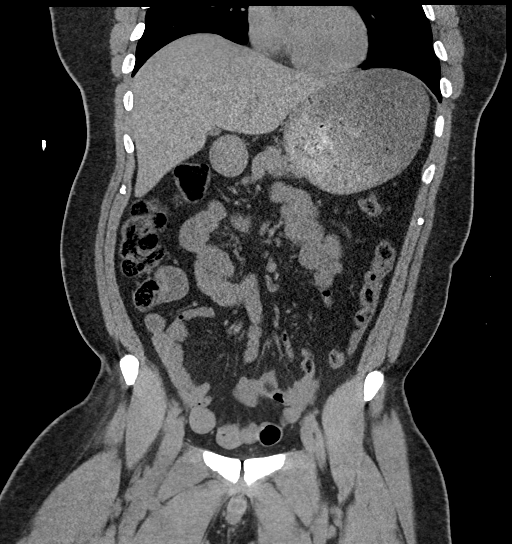
[im 65/117  soft-tissue]
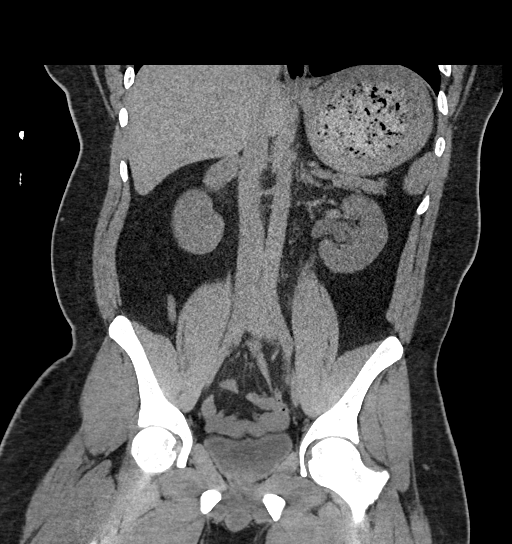

[16 of 46 positions shown; findings below may reference images not displayed]

FINDINGS: Lower chest: The lung bases are clear without focal nodule, mass, or
airspace disease. The heart size is normal.

Hepatobiliary: No focal liver abnormality is seen. No gallstones,
gallbladder wall thickening, or biliary dilatation.

Pancreas: Unremarkable. No pancreatic ductal dilatation or
surrounding inflammatory changes.

Spleen: Normal in size without focal abnormality.

Adrenals/Urinary Tract: Adrenal glands are within normal limits
bilaterally. Right kidney and ureter is normal. Mild left sided
hydronephrosis is present. A punctate nonobstructing stone is
present at the lower pole of the left kidney. The left ureter is
dilated to an obstructing 3 mm stone at the left UVJ. The urinary
bladder is otherwise normal.

Stomach/Bowel: The stomach is mildly distended. No obstructing
lesion is present. The duodenum and small bowel are normal. Terminal
ileum is within normal limits. The appendix is visualized and
normal. The ascending and transverse colon are within normal limits.
The descending and sigmoid colon are normal.

Vascular/Lymphatic: No significant vascular findings are present. No
enlarged abdominal or pelvic lymph nodes.

Reproductive: Prostate is unremarkable.

Other: No abdominal wall hernia or abnormality. No abdominopelvic
ascites.

Musculoskeletal: Vertebral body heights and alignment are
maintained. Pelvis is normal. Hips are located and normal
bilaterally.
IMPRESSION: 1. Obstructing 3 mm stone in the distal left ureter at the UVJ with
mild left-sided hydronephrosis.
2. Punctate nonobstructing stone at the lower pole of the left
kidney.

## 2020-10-24 ENCOUNTER — Encounter (HOSPITAL_COMMUNITY): Payer: Self-pay | Admitting: Emergency Medicine

## 2020-10-24 ENCOUNTER — Emergency Department (HOSPITAL_COMMUNITY)
Admission: EM | Admit: 2020-10-24 | Discharge: 2020-10-24 | Disposition: A | Discharge status: Home / Self Care | Payer: Self-pay | Attending: Emergency Medicine | Admitting: Emergency Medicine

## 2020-10-24 DIAGNOSIS — B349 Viral infection, unspecified: Secondary | ICD-10-CM | POA: Insufficient documentation

## 2020-10-24 DIAGNOSIS — U071 COVID-19: Secondary | ICD-10-CM | POA: Insufficient documentation

## 2020-10-24 LAB — RESP PANEL BY RT-PCR (FLU A&B, COVID) ARPGX2
Influenza A by PCR: NEGATIVE
Influenza B by PCR: NEGATIVE
SARS Coronavirus 2 by RT PCR: POSITIVE — AB

## 2020-10-24 MED ORDER — ACETAMINOPHEN 325 MG PO TABS
650.0000 mg | ORAL_TABLET | Freq: Once | ORAL | Status: AC
  Administered 2020-10-24: 650 mg via ORAL
  Filled 2020-10-24: qty 2

## 2020-10-24 NOTE — ED Triage Notes (Signed)
Pt here from home with c/o /h/a fever over the last 2 days , no cough or sob

## 2020-10-24 NOTE — Discharge Instructions (Signed)
Please check my chart for test results If COVID-positive, quarantine and you may return to work on Wednesday, August 17 as long as your symptoms are resolving and you no longer have a fever Use Tylenol and alternate with ibuprofen for fever, body aches, and others symptoms Return if you are worsening and are unable to tolerate liquids or have new or worsening symptoms

## 2020-10-24 NOTE — ED Provider Notes (Signed)
[facility] EMERGENCY DEPARTMENT Provider Note   CSN: [id number] Arrival date & time: 10/24/20  4580     History No chief complaint on file.   Blaize Epple is a 30 y.o. male.  HPI 30 year old male presents today complaining of fever, nasal congestion, headache.  Symptoms began on Wednesday with subjective fever.  He has been using OTC cold and fever medications.  He denies any vision changes, stiff neck, difficulty breathing, nausea, vomiting, or diarrhea.  He has no known exposures to COVID.  He has been vaccinated but has not received booster.     No past medical history on file.  There are no problems to display for this patient.   No past surgical history on file.     No family history on file.  Social History   Tobacco Use   Smoking status: Never   Smokeless tobacco: Never  Substance Use Topics   Alcohol use: No   Drug use: No    Home Medications Prior to Admission medications   Medication Sig Start Date End Date Taking? Authorizing Provider  diphenhydrAMINE (BENADRYL) 25 mg capsule Take 1 capsule (25 mg total) by mouth every 8 (eight) hours as needed. Patient not taking: Reported on 05/19/2018 10/29/14   Earley Favor, NP  HYDROcodone-acetaminophen [facility]) 5-325 MG tablet Take 1 tablet by mouth every 6 (six) hours as needed. 05/19/18   Dartha Lodge, PA-C  ibuprofen (ADVIL,MOTRIN) 600 MG tablet Take 1 tablet (600 mg total) by mouth every 6 (six) hours as needed. 05/19/18   Dartha Lodge, PA-C  ondansetron (ZOFRAN ODT) 4 MG disintegrating tablet 4mg  ODT q4 hours prn nausea/vomit 05/19/18   07/19/18, PA-C  predniSONE (STERAPRED UNI-PAK 21 TAB) 10 MG (21) TBPK tablet Take 1 tablet (10 mg total) by mouth daily. Take 6 tabs by mouth daily  for 2 days, then 5 tabs for 2 days, then 4 tabs for 2 days, then 3 tabs for 2 days, 2 tabs for 2 days, then 1 tab by mouth daily for 2 days Patient not taking: Reported on 05/19/2018 10/29/14   10/31/14, NP   pseudoephedrine (SUDAFED) 30 MG tablet Take 30 mg by mouth every 8 (eight) hours as needed for congestion.    [provider]  tamsulosin (FLOMAX) 0.4 MG CAPS capsule Take 1 capsule (0.4 mg total) by mouth daily. 05/19/18   07/19/18, PA-C    Allergies    Patient has no known allergies.  Review of Systems   Review of Systems  All other systems reviewed and are negative.  Physical Exam Updated Vital Signs BP 135/84 (BP Location: Right Arm)   Pulse 95   Temp (!) 102.4 F (39.1 C)   Resp 16   SpO2 96%   Physical Exam Vitals and nursing note reviewed.  Constitutional:      Appearance: He is well-developed.  HENT:     Head: Normocephalic and atraumatic.     Right Ear: External ear normal.     Left Ear: External ear normal.     Nose: Nose normal.     Mouth/Throat:     Mouth: Mucous membranes are moist.     Pharynx: Oropharynx is clear.  Eyes:     Extraocular Movements: Extraocular movements intact.  Neck:     Trachea: No tracheal deviation.  Cardiovascular:     Rate and Rhythm: Normal rate and regular rhythm.  Pulmonary:     Effort: Pulmonary effort is normal.  Abdominal:     General: Bowel sounds are normal.     Palpations: Abdomen is soft.  Musculoskeletal:        General: Normal range of motion.     Cervical back: Normal range of motion and neck supple.  Skin:    General: Skin is warm and dry.  Neurological:     Mental Status: He is alert and oriented to person, place, and time.  Psychiatric:        Mood and Affect: Mood normal.        Behavior: Behavior normal.    ED Results / Procedures / Treatments   Labs (all labs ordered are listed, but only abnormal results are displayed) Labs Reviewed  RESP PANEL BY RT-PCR (FLU A&B, COVID) [id number]    EKG None  Radiology No results found.  Procedures Procedures   Medications Ordered in ED Medications  acetaminophen (TYLENOL) tablet 650 mg (has no administration in time range)    ED Course  I  have reviewed the triage vital signs and the nursing notes.  Pertinent labs & imaging results that were available during my care of the patient were reviewed by me and considered in my medical decision making (see chart for details).    MDM Rules/Calculators/A&P                           Well-developed well-nourished male otherwise healthy presents today with viral syndrome.  COVID test has been ordered.  Discussed return precautions and need for follow-up as well as length of time patient will need to remain out of work if COVID is positive.  Discussed symptomatic care.  Patient voices understanding. Final Clinical Impression(s) / ED Diagnoses Final diagnoses:  Viral syndrome    Rx / DC Orders ED Discharge Orders     None        Margarita Grizzle, MD 10/24/20 [phone]

## 2021-01-28 ENCOUNTER — Emergency Department (HOSPITAL_COMMUNITY)
Admission: EM | Admit: 2021-01-28 | Discharge: 2021-01-28 | Disposition: A | Discharge status: Home / Self Care | Payer: Self-pay | Attending: Student | Admitting: Student

## 2021-01-28 ENCOUNTER — Encounter (HOSPITAL_COMMUNITY): Payer: Self-pay | Admitting: *Deleted

## 2021-01-28 ENCOUNTER — Emergency Department (HOSPITAL_COMMUNITY): Payer: Self-pay

## 2021-01-28 ENCOUNTER — Other Ambulatory Visit: Payer: Self-pay

## 2021-01-28 DIAGNOSIS — R319 Hematuria, unspecified: Secondary | ICD-10-CM | POA: Insufficient documentation

## 2021-01-28 LAB — CBC WITH DIFFERENTIAL/PLATELET
Abs Immature Granulocytes: 0.02 10*3/uL (ref 0.00–0.07)
Basophils Absolute: 0 10*3/uL (ref 0.0–0.1)
Basophils Relative: 0 %
Eosinophils Absolute: 0.1 10*3/uL (ref 0.0–0.5)
Eosinophils Relative: 2 %
HCT: 46.6 % (ref 39.0–52.0)
Hemoglobin: 15.9 g/dL (ref 13.0–17.0)
Immature Granulocytes: 0 %
Lymphocytes Relative: 29 %
Lymphs Abs: 2.1 10*3/uL (ref 0.7–4.0)
MCH: 30.5 pg (ref 26.0–34.0)
MCHC: 34.1 g/dL (ref 30.0–36.0)
MCV: 89.4 fL (ref 80.0–100.0)
Monocytes Absolute: 0.6 10*3/uL (ref 0.1–1.0)
Monocytes Relative: 8 %
Neutro Abs: 4.3 10*3/uL (ref 1.7–7.7)
Neutrophils Relative %: 61 %
Platelets: 279 10*3/uL (ref 150–400)
RBC: 5.21 MIL/uL (ref 4.22–5.81)
RDW: 13.2 % (ref 11.5–15.5)
WBC: 7.2 10*3/uL (ref 4.0–10.5)
nRBC: 0 % (ref 0.0–0.2)

## 2021-01-28 LAB — BASIC METABOLIC PANEL
Anion gap: 7 (ref 5–15)
BUN: 13 mg/dL (ref 6–20)
CO2: 27 mmol/L (ref 22–32)
Calcium: 9.2 mg/dL (ref 8.9–10.3)
Chloride: 103 mmol/L (ref 98–111)
Creatinine, Ser: 1.14 mg/dL (ref 0.61–1.24)
GFR, Estimated: >60 mL/min (ref >60)
Glucose, Bld: 98 mg/dL (ref 70–99)
Potassium: 3.7 mmol/L (ref 3.5–5.1)
Sodium: 137 mmol/L (ref 135–145)

## 2021-01-28 LAB — URINALYSIS, ROUTINE W REFLEX MICROSCOPIC
Bacteria, UA: NONE SEEN
Bilirubin Urine: NEGATIVE
Glucose, UA: NEGATIVE mg/dL
Ketones, ur: NEGATIVE mg/dL
Leukocytes,Ua: NEGATIVE
Nitrite: NEGATIVE
Protein, ur: NEGATIVE mg/dL
RBC / HPF: >50 RBC/hpf — ABNORMAL HIGH (ref 0–5)
Specific Gravity, Urine: 1.023 (ref 1.005–1.030)
pH: 5 (ref 5.0–8.0)

## 2021-01-28 NOTE — ED Provider Notes (Signed)
Emergency Medicine Provider Triage Evaluation Note  Phillip Flores , a 30 y.o. male  was evaluated in triage.  Pt complains of hematuria x3 to 4 days.  Initially it was associated with onset of right-sided back pain, the pain has subsided.  No dysuria, history of kidney stones but this does not feel like a kidney stone.  Reports the back pain started after lifting heavy objects..  Review of Systems  Positive: Hematuria  Negative: Fevers, abdominal pain, nausea, vomiting, dysuria   Physical Exam  BP (!) 136/91 (BP Location: Right Arm)   Pulse 75   Temp 98.4 F (36.9 C)   Resp 17   SpO2 94%  Gen:   Awake, no distress   Resp:  Normal effort  MSK:   Moves extremities without difficulty  Other:  Abdomen is soft and nontender, no CVA tenderness  Medical Decision Making  Medically screening exam initiated at 7:54 AM.  Appropriate orders placed.  Phillip Flores was informed that the remainder of the evaluation will be completed by another provider, this initial triage assessment does not replace that evaluation, and the importance of remaining in the ED until their evaluation is complete.  BMP, UA   Theron Arista, PA-C 01/28/21 [id number]    Derwood Kaplan, MD 01/31/21 2251

## 2021-01-28 NOTE — ED Triage Notes (Signed)
Pt reports episode of right back pain days ago that resolved, now having blood in urine. Denies any pain with urination.

## 2021-01-28 NOTE — ED Provider Notes (Signed)
[facility] EMERGENCY DEPARTMENT Provider Note   CSN: [id number] Arrival date & time: 01/28/21  [id number]     History Chief Complaint  Patient presents with   Hematuria    Phillip Flores is a 30 y.o. male with PMH nephrolithiasis who presents the emergency department for evaluation of right flank pain and hematuria.  Patient states that his flank pain began 4 days ago and has since resolved, but he still has intermittent hematuria.  He denies chest pain, shortness of breath, abdominal pain, nausea, vomiting, fever or other systemic symptoms.  Denies dysuria or penile rash or discharge.  -   Hematuria Pertinent negatives include no chest pain, no abdominal pain and no shortness of breath.      History reviewed. No pertinent past medical history.  There are no problems to display for this patient.   History reviewed. No pertinent surgical history.     History reviewed. No pertinent family history.  Social History   Tobacco Use   Smoking status: Never   Smokeless tobacco: Never  Substance Use Topics   Alcohol use: No   Drug use: No    Home Medications Prior to Admission medications   Medication Sig Start Date End Date Taking? Authorizing Provider  diphenhydrAMINE (BENADRYL) 25 mg capsule Take 1 capsule (25 mg total) by mouth every 8 (eight) hours as needed. Patient not taking: Reported on 05/19/2018 10/29/14   Junius Creamer, NP  HYDROcodone-acetaminophen [facility]) 5-325 MG tablet Take 1 tablet by mouth every 6 (six) hours as needed. 05/19/18   Jacqlyn Larsen, PA-C  ibuprofen (ADVIL,MOTRIN) 600 MG tablet Take 1 tablet (600 mg total) by mouth every 6 (six) hours as needed. 05/19/18   Jacqlyn Larsen, PA-C  ondansetron (ZOFRAN ODT) 4 MG disintegrating tablet 4mg  ODT q4 hours prn nausea/vomit 05/19/18   Jacqlyn Larsen, PA-C  predniSONE (STERAPRED UNI-PAK 21 TAB) 10 MG (21) TBPK tablet Take 1 tablet (10 mg total) by mouth daily. Take 6 tabs by mouth daily  for 2  days, then 5 tabs for 2 days, then 4 tabs for 2 days, then 3 tabs for 2 days, 2 tabs for 2 days, then 1 tab by mouth daily for 2 days Patient not taking: Reported on 05/19/2018 10/29/14   Junius Creamer, NP  pseudoephedrine (SUDAFED) 30 MG tablet Take 30 mg by mouth every 8 (eight) hours as needed for congestion.    [provider]  tamsulosin (FLOMAX) 0.4 MG CAPS capsule Take 1 capsule (0.4 mg total) by mouth daily. 05/19/18   Jacqlyn Larsen, PA-C    Allergies    Patient has no known allergies.  Review of Systems   Review of Systems  Constitutional:  Negative for chills and fever.  HENT:  Negative for ear pain and sore throat.   Eyes:  Negative for pain and visual disturbance.  Respiratory:  Negative for cough and shortness of breath.   Cardiovascular:  Negative for chest pain and palpitations.  Gastrointestinal:  Negative for abdominal pain and vomiting.  Genitourinary:  Positive for flank pain and hematuria. Negative for dysuria.  Musculoskeletal:  Negative for arthralgias and back pain.  Skin:  Negative for color change and rash.  Neurological:  Negative for seizures and syncope.  All other systems reviewed and are negative.  Physical Exam Updated Vital Signs BP 106/66 (BP Location: Right Arm)   Pulse 65   Temp 98.2 F (36.8 C) (Oral)   Resp 18   SpO2 96%  Physical Exam Vitals and nursing note reviewed.  Constitutional:      Appearance: He is well-developed.  HENT:     Head: Normocephalic and atraumatic.  Eyes:     Conjunctiva/sclera: Conjunctivae normal.  Cardiovascular:     Rate and Rhythm: Normal rate and regular rhythm.     Heart sounds: No murmur heard. Pulmonary:     Effort: Pulmonary effort is normal. No respiratory distress.     Breath sounds: Normal breath sounds.  Abdominal:     Palpations: Abdomen is soft.     Tenderness: There is no abdominal tenderness.  Musculoskeletal:     Cervical back: Neck supple.  Skin:    General: Skin is warm and dry.   Neurological:     Mental Status: He is alert.    ED Results / Procedures / Treatments   Labs (all labs ordered are listed, but only abnormal results are displayed) Labs Reviewed  URINALYSIS, ROUTINE W REFLEX MICROSCOPIC - Abnormal; Notable for the following components:      Result Value   Hgb urine dipstick LARGE (*)    RBC / HPF >50 (*)    All other components within normal limits  CBC WITH DIFFERENTIAL/PLATELET  BASIC METABOLIC PANEL    EKG None  Radiology CT Renal Stone Study  Result Date: 01/28/2021 CLINICAL DATA:  Flank pain. Kidney stone suspected. Right flank pain over the last 4-5 days. Gross hematuria EXAM: CT ABDOMEN AND PELVIS WITHOUT CONTRAST TECHNIQUE: Multidetector CT imaging of the abdomen and pelvis was performed following the standard protocol without IV contrast. COMPARISON:  05/19/2018 FINDINGS: Lower chest: Lung bases are clear. Hepatobiliary: Liver parenchyma is normal.  No calcified gallstones. Pancreas: Normal Spleen: Normal Adrenals/Urinary Tract: Adrenal glands are normal. The right kidney is normal. No cyst, mass, stone or hydronephrosis. Left kidney contains multiple small stones. Two small stones in the upper pole appear unchanged since the prior study. On the prior study there was 1 stone in the lower pole. On today's study there appear to be 2 stones in the lower pole. There is a newly seen stone in the right renal pelvis measuring 5 mm in size. This was not visible on the prior study. There is no hydronephrosis, but this could result in hematuria. No stone in the bladder or urethra. Stomach/Bowel: Normal Vascular/Lymphatic: Aorta and IVC are normal.  No adenopathy. Reproductive: Normal Other: None Musculoskeletal: Normal IMPRESSION: Two punctate stones in the upper pole of left kidney are unchanged. 2 punctate stones in the lower pole of the left kidney, with only 1 having been visible previously. Newly seen 5 mm stone in the renal pelvis. No associated  hydronephrosis. This could be a cause of hematuria however. No sign of stone in the ureter, bladder or urethra. Electronically Signed   By: Nelson Chimes M.D.   On: 01/28/2021 09:31    Procedures Procedures   Medications Ordered in ED Medications - No data to display  ED Course  I have reviewed the triage vital signs and the nursing notes.  Pertinent labs & imaging results that were available during my care of the patient were reviewed by me and considered in my medical decision making (see chart for details).    MDM Rules/Calculators/A&P                           Patient seen the emergency department for evaluation of hematuria and flank pain.  Physical exam is unremarkable.  Patient  with greater than 50 red blood cells but no evidence of infection in the urine.  CBC and chemistry unremarkable.  CT stone study with no obstructive stones, multiple punctate stones in the kidney on the left.  Patient given resources to establish urology outpatient follow-up and pain is controlled at this time.  Suspect the patient is already passed a stone and is having remnant hematuria.  Patient then discharged with urology follow-up. Final Clinical Impression(s) / ED Diagnoses Final diagnoses:  Hematuria, unspecified type    Rx / DC Orders ED Discharge Orders     None        Delmon Andrada, Wyn Forster, MD 01/28/21 [phone]

## 2021-01-31 ENCOUNTER — Emergency Department (HOSPITAL_COMMUNITY): Payer: Self-pay

## 2021-01-31 ENCOUNTER — Other Ambulatory Visit: Payer: Self-pay

## 2021-01-31 ENCOUNTER — Emergency Department (HOSPITAL_COMMUNITY)
Admission: EM | Admit: 2021-01-31 | Discharge: 2021-01-31 | Disposition: A | Discharge status: Home / Self Care | Payer: Self-pay | Attending: Emergency Medicine | Admitting: Emergency Medicine

## 2021-01-31 ENCOUNTER — Encounter (HOSPITAL_COMMUNITY): Payer: Self-pay | Admitting: Emergency Medicine

## 2021-01-31 DIAGNOSIS — N201 Calculus of ureter: Secondary | ICD-10-CM

## 2021-01-31 DIAGNOSIS — R109 Unspecified abdominal pain: Secondary | ICD-10-CM

## 2021-01-31 DIAGNOSIS — N132 Hydronephrosis with renal and ureteral calculous obstruction: Secondary | ICD-10-CM | POA: Insufficient documentation

## 2021-01-31 LAB — CBC
HCT: 44.3 % (ref 39.0–52.0)
Hemoglobin: 15 g/dL (ref 13.0–17.0)
MCH: 30.5 pg (ref 26.0–34.0)
MCHC: 33.9 g/dL (ref 30.0–36.0)
MCV: 90.2 fL (ref 80.0–100.0)
Platelets: 282 10*3/uL (ref 150–400)
RBC: 4.91 MIL/uL (ref 4.22–5.81)
RDW: 13.2 % (ref 11.5–15.5)
WBC: 7.6 10*3/uL (ref 4.0–10.5)
nRBC: 0 % (ref 0.0–0.2)

## 2021-01-31 LAB — COMPREHENSIVE METABOLIC PANEL
ALT: 16 U/L (ref 0–44)
AST: 17 U/L (ref 15–41)
Albumin: 3.7 g/dL (ref 3.5–5.0)
Alkaline Phosphatase: 67 U/L (ref 38–126)
Anion gap: 7 (ref 5–15)
BUN: 11 mg/dL (ref 6–20)
CO2: 27 mmol/L (ref 22–32)
Calcium: 9.6 mg/dL (ref 8.9–10.3)
Chloride: 104 mmol/L (ref 98–111)
Creatinine, Ser: 1.2 mg/dL (ref 0.61–1.24)
GFR, Estimated: >60 mL/min (ref >60)
Glucose, Bld: 119 mg/dL — ABNORMAL HIGH (ref 70–99)
Potassium: 3.4 mmol/L — ABNORMAL LOW (ref 3.5–5.1)
Sodium: 138 mmol/L (ref 135–145)
Total Bilirubin: 0.6 mg/dL (ref 0.3–1.2)
Total Protein: 6.6 g/dL (ref 6.5–8.1)

## 2021-01-31 LAB — URINALYSIS, ROUTINE W REFLEX MICROSCOPIC
Bacteria, UA: NONE SEEN
Bilirubin Urine: NEGATIVE
Glucose, UA: NEGATIVE mg/dL
Ketones, ur: NEGATIVE mg/dL
Leukocytes,Ua: NEGATIVE
Nitrite: NEGATIVE
Protein, ur: NEGATIVE mg/dL
RBC / HPF: >50 RBC/hpf — ABNORMAL HIGH (ref 0–5)
Specific Gravity, Urine: 1.017 (ref 1.005–1.030)
pH: 6 (ref 5.0–8.0)

## 2021-01-31 LAB — LIPASE, BLOOD: Lipase: 29 U/L (ref 11–51)

## 2021-01-31 MED ORDER — ONDANSETRON 4 MG PO TBDP
4.0000 mg | ORAL_TABLET | Freq: Once | ORAL | Status: AC
  Administered 2021-01-31: 4 mg via ORAL
  Filled 2021-01-31: qty 1

## 2021-01-31 MED ORDER — NAPROXEN 500 MG PO TABS: 500.0000 mg | ORAL_TABLET | Freq: Two times a day (BID) | ORAL | 0 refills | Status: AC

## 2021-01-31 MED ORDER — HYDROCODONE-ACETAMINOPHEN 5-325 MG PO TABS: 1.0000 | ORAL_TABLET | Freq: Four times a day (QID) | ORAL | 0 refills | Status: DC | PRN

## 2021-01-31 NOTE — ED Notes (Signed)
Patient transported to CT 

## 2021-01-31 NOTE — ED Notes (Signed)
RN reviewed discharge instructions w/ pt. Follow up and pain prescriptions reviewed, pt had no further questions.

## 2021-01-31 NOTE — ED Provider Notes (Addendum)
[facility] EMERGENCY DEPARTMENT Provider Note   CSN: [id number] Arrival date & time: 01/31/21  0147     History Chief Complaint  Patient presents with   Abdominal Pain    Phillip Flores is a 30 y.o. male.  Patient seen November 16 for right-sided flank pain.  Had CT renal studies done at that time that showed stones in the kidney on the left side nothing to explain the pain on the right side.  Patient did not have left flank pain at that time.  Did have hematuria.  Certainly stones could explain that.  Patient not started on antibiotics.  Urine was not cultured.  The onset of the flank pain was at midnight.  It also includes a left lower quadrant area.  No nausea no vomiting.  Originally was an 8 out of 10 was pretty severe now it is a 2 out of 10.      History reviewed. No pertinent past medical history.  There are no problems to display for this patient.   History reviewed. No pertinent surgical history.     No family history on file.  Social History   Tobacco Use   Smoking status: Never   Smokeless tobacco: Never  Substance Use Topics   Alcohol use: No   Drug use: No    Home Medications Prior to Admission medications   Medication Sig Start Date End Date Taking? Authorizing Provider  diphenhydrAMINE (BENADRYL) 25 mg capsule Take 1 capsule (25 mg total) by mouth every 8 (eight) hours as needed. Patient not taking: Reported on 05/19/2018 10/29/14   Earley Favor, NP  HYDROcodone-acetaminophen [facility]) 5-325 MG tablet Take 1 tablet by mouth every 6 (six) hours as needed. 05/19/18   Dartha Lodge, PA-C  ibuprofen (ADVIL,MOTRIN) 600 MG tablet Take 1 tablet (600 mg total) by mouth every 6 (six) hours as needed. 05/19/18   Dartha Lodge, PA-C  ondansetron (ZOFRAN ODT) 4 MG disintegrating tablet 4mg  ODT q4 hours prn nausea/vomit 05/19/18   07/19/18, PA-C  predniSONE (STERAPRED UNI-PAK 21 TAB) 10 MG (21) TBPK tablet Take 1 tablet (10 mg total) by mouth daily.  Take 6 tabs by mouth daily  for 2 days, then 5 tabs for 2 days, then 4 tabs for 2 days, then 3 tabs for 2 days, 2 tabs for 2 days, then 1 tab by mouth daily for 2 days Patient not taking: Reported on 05/19/2018 10/29/14   10/31/14, NP  pseudoephedrine (SUDAFED) 30 MG tablet Take 30 mg by mouth every 8 (eight) hours as needed for congestion.    [provider]  tamsulosin (FLOMAX) 0.4 MG CAPS capsule Take 1 capsule (0.4 mg total) by mouth daily. 05/19/18   07/19/18, PA-C    Allergies    Patient has no known allergies.  Review of Systems   Review of Systems  Constitutional:  Negative for chills and fever.  HENT:  Negative for ear pain and sore throat.   Eyes:  Negative for pain and visual disturbance.  Respiratory:  Negative for cough and shortness of breath.   Cardiovascular:  Negative for chest pain and palpitations.  Gastrointestinal:  Positive for abdominal pain. Negative for nausea and vomiting.  Genitourinary:  Positive for flank pain and frequency. Negative for dysuria and hematuria.  Musculoskeletal:  Negative for arthralgias and back pain.  Skin:  Negative for color change and rash.  Neurological:  Negative for seizures and syncope.  All other systems reviewed and  are negative.  Physical Exam Updated Vital Signs BP 126/87   Pulse 63   Temp 98.3 F (36.8 C) (Oral)   Resp 18   SpO2 97%   Physical Exam Vitals and nursing note reviewed.  Constitutional:      General: He is not in acute distress.    Appearance: Normal appearance. He is well-developed.  HENT:     Head: Normocephalic and atraumatic.  Eyes:     Conjunctiva/sclera: Conjunctivae normal.     Pupils: Pupils are equal, round, and reactive to light.  Cardiovascular:     Rate and Rhythm: Normal rate and regular rhythm.     Heart sounds: No murmur heard. Pulmonary:     Effort: Pulmonary effort is normal. No respiratory distress.     Breath sounds: Normal breath sounds.  Abdominal:     General:  There is no distension.     Palpations: Abdomen is soft.     Tenderness: There is no abdominal tenderness. There is no guarding.  Musculoskeletal:        General: No swelling.     Cervical back: Neck supple.  Skin:    General: Skin is warm and dry.     Capillary Refill: Capillary refill takes less than 2 seconds.  Neurological:     Mental Status: He is alert.  Psychiatric:        Mood and Affect: Mood normal.    ED Results / Procedures / Treatments   Labs (all labs ordered are listed, but only abnormal results are displayed) Labs Reviewed  COMPREHENSIVE METABOLIC PANEL - Abnormal; Notable for the following components:      Result Value   Potassium 3.4 (*)    Glucose, Bld 119 (*)    All other components within normal limits  URINALYSIS, ROUTINE W REFLEX MICROSCOPIC - Abnormal; Notable for the following components:   APPearance HAZY (*)    Hgb urine dipstick LARGE (*)    RBC / HPF >50 (*)    All other components within normal limits  URINE CULTURE  LIPASE, BLOOD  CBC    EKG None  Radiology CT Renal Stone Study  Result Date: 01/31/2021 CLINICAL DATA:  Hx kidney stones 2 years ago, pain L flank, began to increase so here for pain EXAM: CT ABDOMEN AND PELVIS WITHOUT CONTRAST TECHNIQUE: Multidetector CT imaging of the abdomen and pelvis was performed following the standard protocol without IV contrast. COMPARISON:  CT abdomen pelvis 01/28/2021 FINDINGS: Lower chest: Gynecomastia.  No acute findings in the lung bases. Evaluation of the abdominal viscera limited by the lack of IV contrast. Hepatobiliary: No focal liver abnormality is seen. Normal appearance of the gallbladder. Pancreas: Unremarkable. No surrounding inflammatory changes. Spleen: Normal in size without focal abnormality. Adrenals/Urinary Tract: Adrenal glands are unremarkable. No right renal calculi or hydronephrosis. There are 2 small left renal calculi. There is mild left hydronephrosis secondary to a 4 mm calculus  in the distal left ureter just above the UVJ (series 3, image 83). No renal mass lesion. Urinary bladder is unremarkable. Stomach/Bowel: Stomach is within normal limits. Appendix appears normal. No evidence of bowel wall thickening, distention, or inflammatory changes. Vascular/Lymphatic: Vascular patency cannot be assessed in the absence of IV contrast. No enlarged abdominal or pelvic lymph nodes. Reproductive: Prostate is unremarkable. Other: No abdominal wall hernia or abnormality. No abdominopelvic ascites. Musculoskeletal: No acute or significant osseous findings. IMPRESSION: 1. There is mild left hydronephrosis secondary to an obstructing 4 mm calculus in the distal left  ureter just above the UVJ. 2. There are two additional small left renal calculi. Electronically Signed   By: Audie Pinto M.D.   On: 01/31/2021 13:46    Procedures Procedures   Medications Ordered in ED Medications  ondansetron (ZOFRAN-ODT) disintegrating tablet 4 mg (4 mg Oral Given 01/31/21 0232)    ED Course  I have reviewed the triage vital signs and the nursing notes.  Pertinent labs & imaging results that were available during my care of the patient were reviewed by me and considered in my medical decision making (see chart for details).    MDM Rules/Calculators/A&P                           Suspect 1 of those kidney stones is moved in the left renal area we will get CT renal study again to see if that is the cause of the left flank pain left lower quadrant abdominal pain.  Labs here still has hematuria.  Urine sent for culture.  Electrolytes without significant abnormalities.  Potassium slightly low at 3.4.  Renal function is normal GFR is greater than 60.  No leukocytosis no anemia.  CT renal confirms left-sided 4 mm ureteral stone.  Distal left ureter.  Patient will probably pass this fine on his own.  We will have him follow-up with urology.  And also will have him take Naprosyn and hydrocodone as needed  for pain.   Final Clinical Impression(s) / ED Diagnoses Final diagnoses:  Flank pain  Left ureteral stone    Rx / DC Orders ED Discharge Orders     None        Fredia Sorrow, MD 01/31/21 1305    Fredia Sorrow, MD 01/31/21 1445

## 2021-01-31 NOTE — Discharge Instructions (Addendum)
Studies show a left ureteral stone kidney stone you should be opacities 4 mm in size.  Take the Naprosyn prescription as directed.  Take the hydrocodone pain medication as needed.  Make an appointment to follow-up with urology.  Drink plenty of fluids.  Return for any new or worse symptoms to include fever or persistent vomiting or worse pain.  Most likely will pass the stone sometime in the next 24 hours.

## 2021-01-31 NOTE — ED Triage Notes (Signed)
Patient here with abdominal pain, which feel like muscle spasms, that started at midnight.  He states that he ate Dione Plover around 7pm and thinks the guacamole was not good.  No nausea or vomiting.  No diarrhea.

## 2021-02-01 LAB — URINE CULTURE: Culture: 20000 — AB

## 2021-02-02 ENCOUNTER — Telehealth: Payer: Self-pay | Admitting: Emergency Medicine

## 2021-02-02 NOTE — Telephone Encounter (Signed)
Post ED Visit - Positive Culture Follow-up  Culture report reviewed by antimicrobial stewardship pharmacist: [facility] []  , Pharm.D. []  Enzo Bi, Pharm.D., BCPS AQ-ID []  , Pharm.D., BCPS []  Celedonio Miyamoto, Pharm.D., BCPS []  [place], Garvin Fila.D., BCPS, AAHIVP []  , Pharm.D., BCPS, AAHIVP []  Georgina Pillion, PharmD, BCPS []  , PharmD, BCPS []  [place], PharmD, BCPS []  [address], PharmD []  , PharmD, BCPS []  Estella Husk, PharmD  Pharmacy Team []  Lysle Pearl, PharmD []  , PharmD []  Phillips Climes, PharmD []  , Rph []  Agapito Games) , PharmD []  Verlan Friends, PharmD []  , PharmD []  Mervyn Gay, PharmD []  , PharmD []  Vinnie Level, PharmD []  [facility], PharmD []  , PharmD []  Len Childs, PharmD   Positive urine culture Treated with none, low colony count, has urology followup, no further patient follow-up is required at this time.  02/02/2021, 10:23 AM

## 2021-02-20 ENCOUNTER — Emergency Department (HOSPITAL_COMMUNITY)
Admission: EM | Admit: 2021-02-20 | Discharge: 2021-02-20 | Disposition: A | Discharge status: Home / Self Care | Payer: Self-pay | Attending: Emergency Medicine | Admitting: Emergency Medicine

## 2021-02-20 ENCOUNTER — Emergency Department (HOSPITAL_COMMUNITY): Payer: Self-pay

## 2021-02-20 ENCOUNTER — Encounter (HOSPITAL_COMMUNITY): Payer: Self-pay | Admitting: Emergency Medicine

## 2021-02-20 ENCOUNTER — Other Ambulatory Visit: Payer: Self-pay

## 2021-02-20 DIAGNOSIS — N201 Calculus of ureter: Secondary | ICD-10-CM | POA: Insufficient documentation

## 2021-02-20 LAB — URINALYSIS, ROUTINE W REFLEX MICROSCOPIC
Bilirubin Urine: NEGATIVE
Glucose, UA: NEGATIVE mg/dL
Ketones, ur: NEGATIVE mg/dL
Leukocytes,Ua: NEGATIVE
Nitrite: NEGATIVE
Protein, ur: NEGATIVE mg/dL
Specific Gravity, Urine: 1.015 (ref 1.005–1.030)
pH: 6.5 (ref 5.0–8.0)

## 2021-02-20 LAB — COMPREHENSIVE METABOLIC PANEL
ALT: 16 U/L (ref 0–44)
AST: 15 U/L (ref 15–41)
Albumin: 3.6 g/dL (ref 3.5–5.0)
Alkaline Phosphatase: 72 U/L (ref 38–126)
Anion gap: 7 (ref 5–15)
BUN: 10 mg/dL (ref 6–20)
CO2: 26 mmol/L (ref 22–32)
Calcium: 9 mg/dL (ref 8.9–10.3)
Chloride: 103 mmol/L (ref 98–111)
Creatinine, Ser: 1.17 mg/dL (ref 0.61–1.24)
GFR, Estimated: >60 mL/min (ref >60)
Glucose, Bld: 101 mg/dL — ABNORMAL HIGH (ref 70–99)
Potassium: 4 mmol/L (ref 3.5–5.1)
Sodium: 136 mmol/L (ref 135–145)
Total Bilirubin: 0.5 mg/dL (ref 0.3–1.2)
Total Protein: 6.5 g/dL (ref 6.5–8.1)

## 2021-02-20 LAB — CBC WITH DIFFERENTIAL/PLATELET
Abs Immature Granulocytes: 0.02 10*3/uL (ref 0.00–0.07)
Basophils Absolute: 0 10*3/uL (ref 0.0–0.1)
Basophils Relative: 1 %
Eosinophils Absolute: 0.1 10*3/uL (ref 0.0–0.5)
Eosinophils Relative: 1 %
HCT: 44.2 % (ref 39.0–52.0)
Hemoglobin: 15.3 g/dL (ref 13.0–17.0)
Immature Granulocytes: 0 %
Lymphocytes Relative: 30 %
Lymphs Abs: 2.1 10*3/uL (ref 0.7–4.0)
MCH: 30.8 pg (ref 26.0–34.0)
MCHC: 34.6 g/dL (ref 30.0–36.0)
MCV: 89.1 fL (ref 80.0–100.0)
Monocytes Absolute: 0.5 10*3/uL (ref 0.1–1.0)
Monocytes Relative: 7 %
Neutro Abs: 4.4 10*3/uL (ref 1.7–7.7)
Neutrophils Relative %: 61 %
Platelets: 275 10*3/uL (ref 150–400)
RBC: 4.96 MIL/uL (ref 4.22–5.81)
RDW: 12.8 % (ref 11.5–15.5)
WBC: 7.1 10*3/uL (ref 4.0–10.5)
nRBC: 0 % (ref 0.0–0.2)

## 2021-02-20 LAB — URINALYSIS, MICROSCOPIC (REFLEX)
Bacteria, UA: NONE SEEN
Squamous Epithelial / HPF: NONE SEEN (ref 0–5)

## 2021-02-20 MED ORDER — IOHEXOL 300 MG/ML  SOLN
100.0000 mL | Freq: Once | INTRAMUSCULAR | Status: AC | PRN
  Administered 2021-02-20: 100 mL via INTRAVENOUS

## 2021-02-20 MED ORDER — TAMSULOSIN HCL 0.4 MG PO CAPS
0.4000 mg | ORAL_CAPSULE | Freq: Every day | ORAL | 0 refills | Status: AC
Start: 2021-02-20 — End: ?

## 2021-02-20 MED ORDER — ONDANSETRON 4 MG PO TBDP: 4.0000 mg | ORAL_TABLET | Freq: Three times a day (TID) | ORAL | 0 refills | Status: AC | PRN

## 2021-02-20 MED ORDER — HYDROCODONE-ACETAMINOPHEN 5-325 MG PO TABS: 1.0000 | ORAL_TABLET | Freq: Four times a day (QID) | ORAL | 0 refills | Status: AC | PRN

## 2021-02-20 NOTE — ED Notes (Signed)
Patient transported to CT 

## 2021-02-20 NOTE — ED Triage Notes (Signed)
Pt here with c/o right side abd pian that started last night , has known kidney stones all on the left from prior ct scan

## 2021-02-20 NOTE — ED Provider Notes (Signed)
Emergency Medicine Provider Triage Evaluation Note  Phillip Flores , a 30 y.o. male  was evaluated in triage.  Pt complains of RLQ abdominal pain since 9PM last night. Patient recently seen and diagnosed with nephrolithiasis on left x2. States he has passed one kidney stone so far.   Review of Systems  Positive: RLQ abdominal pain, loss of appetite  Negative: N/V/D, blood in stool, fevers  Physical Exam  BP 131/81 (BP Location: Right Arm)   Pulse 78   Temp 98.3 F (36.8 C) (Oral)   Resp 14   SpO2 97%  Gen:   Awake, no distress   Resp:  Normal effort  MSK:   Moves extremities without difficulty  Other:  RLQ abdominal tenderness  Medical Decision Making  Medically screening exam initiated at 10:10 AM.  Appropriate orders placed.  Phillip Flores was informed that the remainder of the evaluation will be completed by another provider, this initial triage assessment does not replace that evaluation, and the importance of remaining in the ED until their evaluation is complete.     Al Decant, PA-C 02/20/21 1012    Terald Sleeper, MD 02/20/21 1029

## 2021-02-20 NOTE — ED Provider Notes (Signed)
[facility] EMERGENCY DEPARTMENT Provider Note   CSN: [id number] Arrival date & time: 02/20/21  [id number]     History No chief complaint on file.   Phillip Flores is a 30 y.o. male.  HPI Patient is a 30 year old male with past medical history significant for recurrent nephrolithiasis presented to the emergency room today with complaints of right lower quadrant abdominal pain since 9 PM last night.  He states it came on sometime after he started eating he denies any nausea or vomiting or diarrhea fevers chills but states he has a somewhat decreased appetite today may be secondary to pain.  Denies any cough congestion or flulike symptoms.  Denies any surgical history.  He states that his last episode of flank pain from nephrolithiasis was 11/19.    History reviewed. No pertinent past medical history.  There are no problems to display for this patient.   History reviewed. No pertinent surgical history.     History reviewed. No pertinent family history.  Social History   Tobacco Use   Smoking status: Never   Smokeless tobacco: Never  Substance Use Topics   Alcohol use: No   Drug use: No    Home Medications Prior to Admission medications   Medication Sig Start Date End Date Taking? Authorizing Provider  diphenhydrAMINE (BENADRYL) 25 mg capsule Take 1 capsule (25 mg total) by mouth every 8 (eight) hours as needed. Patient not taking: Reported on 05/19/2018 10/29/14   Junius Creamer, NP  HYDROcodone-acetaminophen (NORCO/VICODIN) 5-325 MG tablet Take 1 tablet by mouth every 6 (six) hours as needed for severe pain. 02/20/21  Yes Zoeann Mol S, PA  ibuprofen (ADVIL,MOTRIN) 600 MG tablet Take 1 tablet (600 mg total) by mouth every 6 (six) hours as needed. 05/19/18   Jacqlyn Larsen, PA-C  naproxen (NAPROSYN) 500 MG tablet Take 1 tablet (500 mg total) by mouth 2 (two) times daily. 01/31/21   Fredia Sorrow, MD  ondansetron (ZOFRAN-ODT) 4 MG disintegrating tablet Take 1  tablet (4 mg total) by mouth every 8 (eight) hours as needed for nausea or vomiting. 02/20/21  Yes Sixto Bowdish S, PA  predniSONE (STERAPRED UNI-PAK 21 TAB) 10 MG (21) TBPK tablet Take 1 tablet (10 mg total) by mouth daily. Take 6 tabs by mouth daily  for 2 days, then 5 tabs for 2 days, then 4 tabs for 2 days, then 3 tabs for 2 days, 2 tabs for 2 days, then 1 tab by mouth daily for 2 days Patient not taking: Reported on 05/19/2018 10/29/14   Junius Creamer, NP  pseudoephedrine (SUDAFED) 30 MG tablet Take 30 mg by mouth every 8 (eight) hours as needed for congestion.    [provider]  tamsulosin (FLOMAX) 0.4 MG CAPS capsule Take 1 capsule (0.4 mg total) by mouth daily after supper. 02/20/21  Yes Tedd Sias, PA    Allergies    Patient has no known allergies.  Review of Systems   Review of Systems  Constitutional:  Negative for chills and fever.  HENT:  Negative for congestion.   Eyes:  Negative for pain.  Respiratory:  Negative for cough and shortness of breath.   Cardiovascular:  Negative for chest pain and leg swelling.  Gastrointestinal:  Positive for abdominal pain and nausea. Negative for vomiting.  Genitourinary:  Negative for dysuria.  Musculoskeletal:  Negative for myalgias.  Skin:  Negative for rash.  Neurological:  Negative for dizziness and headaches.   Physical Exam Updated Vital Signs BP Marland Kitchen)  132/94   Pulse 68   Temp 98.3 F (36.8 C) (Oral)   Resp 14   SpO2 99%   Physical Exam Vitals and nursing note reviewed.  Constitutional:      General: He is not in acute distress. HENT:     Head: Normocephalic and atraumatic.     Nose: Nose normal.  Eyes:     General: No scleral icterus. Cardiovascular:     Rate and Rhythm: Normal rate and regular rhythm.     Pulses: Normal pulses.     Heart sounds: Normal heart sounds.  Pulmonary:     Effort: Pulmonary effort is normal. No respiratory distress.     Breath sounds: No wheezing.  Abdominal:     Palpations:  Abdomen is soft.     Tenderness: There is no abdominal tenderness.     Comments: Abdomen soft not particularly tender to palpation.  Some mild right lower quadrant tenderness with deep palpation.  No guarding or rebound.  Musculoskeletal:     Cervical back: Normal range of motion.     Right lower leg: No edema.     Left lower leg: No edema.  Skin:    General: Skin is warm and dry.     Capillary Refill: Capillary refill takes less than 2 seconds.  Neurological:     Mental Status: He is alert. Mental status is at baseline.  Psychiatric:        Mood and Affect: Mood normal.        Behavior: Behavior normal.    ED Results / Procedures / Treatments   Labs (all labs ordered are listed, but only abnormal results are displayed) Labs Reviewed  COMPREHENSIVE METABOLIC PANEL - Abnormal; Notable for the following components:      Result Value   Glucose, Bld 101 (*)    All other components within normal limits  CBC WITH DIFFERENTIAL/PLATELET  URINALYSIS, ROUTINE W REFLEX MICROSCOPIC    EKG None  Radiology CT ABDOMEN PELVIS W CONTRAST  Result Date: 02/20/2021 CLINICAL DATA:  Right lower quadrant abdominal pain. History of renal calculi. EXAM: CT ABDOMEN AND PELVIS WITH CONTRAST TECHNIQUE: Multidetector CT imaging of the abdomen and pelvis was performed using the standard protocol following bolus administration of intravenous contrast. CONTRAST:  OMNIPAQUE IOHEXOL 300 MG/ML  SOLN COMPARISON:  CT of the abdomen and pelvis without contrast on 01/31/2021 FINDINGS: Lower chest: No acute abnormality. Hepatobiliary: No focal liver abnormality is seen. No gallstones, gallbladder wall thickening, or biliary dilatation. Pancreas: Unremarkable. No pancreatic ductal dilatation or surrounding inflammatory changes. Spleen: Normal in size without focal abnormality. Adrenals/Urinary Tract: There is mild right-sided hydronephrosis and mild dilatation of the right ureter. Is the right ureter is followed  inferiorly a punctate calcification is present at the right ureterovesical junction measuring roughly 2 mm and consistent with AA distal calculus. The bladder is unremarkable. The left kidney and adrenal glands are unremarkable. Stomach/Bowel: Bowel shows no evidence of obstruction, ileus, inflammation or lesion. The appendix is normal. No free intraperitoneal air or focal abscess identified. Vascular/Lymphatic: No significant vascular findings are present. No enlarged abdominal or pelvic lymph nodes. Reproductive: Prostate is unremarkable. Other: No abdominal wall hernia or abnormality. No abdominopelvic ascites. Musculoskeletal: No acute or significant osseous findings. IMPRESSION: Mild right-sided hydronephrosis and hydroureter secondary to a small 2 mm calculus located at the right ureterovesical junction. Electronically Signed   By: Irish Lack M.D.   On: 02/20/2021 12:11    Procedures Procedures   Medications Ordered  in ED Medications  iohexol (OMNIPAQUE) 300 MG/ML solution 100 mL (100 mLs Intravenous Contrast Given 02/20/21 1158)    ED Course  I have reviewed the triage vital signs and the nursing notes.  Pertinent labs & imaging results that were available during my care of the patient were reviewed by me and considered in my medical decision making (see chart for details).  Clinical Course as of 02/20/21 1337  Fri Feb 20, 2021  1305 IMPRESSION: Mild right-sided hydronephrosis and hydroureter secondary to a small 2 mm calculus located at the right ureterovesical junction. [WF]    Clinical Course User Index [WF] Tedd Sias, PA   MDM Rules/Calculators/A&P                          Patient is a well-appearing 30 year old male in minimal pain at this time but states he has had intermittent right-sided lower abdominal pain since last night.  Has a history of kidney stones seems that his pain came on rather abruptly  Endorses some nausea but no vomiting  Physical exam is  notable for some mild right lower quadrant abdominal tenderness no guarding or rebound.   I personally reviewed all laboratory work and imaging.  Metabolic panel without any acute abnormality specifically kidney function within normal limits and no significant electrolyte abnormalities. CBC without leukocytosis or significant anemia.   No lipase was ordered however I do not think it is indicated this patient has no epigastric pain no history of pancreatitis and symptoms are consistent with this nor is physical exam.  Personally reviewed CT abdomen pelvis with contrast and agree of radiology read 2 mm calculus at UVJ denies any other acute abnormality. No appendicitis.  Urine : w some hematuria consistent with ureterolithiasis  Patient tolerating p.o. well-appearing and pain-free at this point has not received any analgesia here in the ER.  I provided him with a few additional tablets of Norco for home he is still has some pills from last kidney stone.  Also gave tamsulosin and Zofran.  We had a lengthy discussion about dietary modifications.  Final Clinical Impression(s) / ED Diagnoses Final diagnoses:  Ureterolithiasis    Rx / DC Orders ED Discharge Orders          Ordered    tamsulosin (FLOMAX) 0.4 MG CAPS capsule  Daily after supper        02/20/21 1335    ondansetron (ZOFRAN-ODT) 4 MG disintegrating tablet  Every 8 hours PRN        02/20/21 1335    HYDROcodone-acetaminophen (NORCO/VICODIN) 5-325 MG tablet  Every 6 hours PRN        02/20/21 1335             Tedd Sias, [place] 02/20/21 1401    Gareth Morgan, MD 02/20/21 1631

## 2021-02-20 NOTE — Discharge Instructions (Addendum)
You have a 2 mm kidney stone on the right side of your bladder.  Please strain all of your urine please follow-up with urology You may take the Percocet that you are already prescribed for pain.  I have written down, Tylenol ibuprofen you can take at home below.  I prescribed you some Zofran for any nausea you should experience.  I have also written you prescription for tamsulosin which will help you pass this kidney stone  Ultimately is very important to hydrate you may add a small amount of lemon juice to each bottle of water that you drink  I recommend drinking least glasses of water a day.  Please use Tylenol or ibuprofen for pain.  You may use 600 mg ibuprofen every 6 hours or 1000 mg of Tylenol every 6 hours.  You may choose to alternate between the 2.  This would be most effective.  Not to exceed 4 g of Tylenol within 24 hours.  Not to exceed 3200 mg ibuprofen 24 hours.

## 2021-07-16 ENCOUNTER — Encounter (HOSPITAL_COMMUNITY): Payer: Self-pay | Admitting: Emergency Medicine

## 2021-07-16 ENCOUNTER — Emergency Department (HOSPITAL_COMMUNITY): Payer: Self-pay

## 2021-07-16 ENCOUNTER — Other Ambulatory Visit: Payer: Self-pay

## 2021-07-16 ENCOUNTER — Emergency Department (HOSPITAL_COMMUNITY)
Admission: EM | Admit: 2021-07-16 | Discharge: 2021-07-17 | Disposition: A | Discharge status: Home / Self Care | Payer: Self-pay | Attending: Emergency Medicine | Admitting: Emergency Medicine

## 2021-07-16 DIAGNOSIS — Y990 Civilian activity done for income or pay: Secondary | ICD-10-CM | POA: Insufficient documentation

## 2021-07-16 DIAGNOSIS — X501XXA Overexertion from prolonged static or awkward postures, initial encounter: Secondary | ICD-10-CM | POA: Insufficient documentation

## 2021-07-16 DIAGNOSIS — S93601A Unspecified sprain of right foot, initial encounter: Secondary | ICD-10-CM | POA: Insufficient documentation

## 2021-07-16 NOTE — ED Triage Notes (Signed)
Pt reported to ED with c/o pain to right ankle x3 days. Cannot recollect injury to area. ?

## 2021-07-16 NOTE — ED Provider Triage Note (Signed)
Emergency Medicine Provider Triage Evaluation Note ? ?Phillip Flores , a 31 y.o. male  was evaluated in triage.  Pt complains of right heel pain.  Started 2 to 3 days ago, patient is unsure about any inciting incident but states he works on his feet throughout the day.  Denies any paresthesias, pain is localized to the heel and does not radiate up to his ankle or knee.. ? ?Review of Systems  ?Per HPI ? ?Physical Exam  ?BP 128/81 (BP Location: Left Arm)   Pulse 87   Temp 99.2 ?F (37.3 ?C) (Oral)   Resp 18   SpO2 95%  ?Gen:   Awake, no distress   ?Resp:  Normal effort  ?MSK:   Moves extremities without difficulty  ?Other:  DP PT 2+, tenderness to palpation over the calcaneus, no tenderness over the malleolus laterally or medially. ? ?Medical Decision Making  ?Medically screening exam initiated at 10:15 PM.  Appropriate orders placed.  Myran Arcia was informed that the remainder of the evaluation will be completed by another provider, this initial triage assessment does not replace that evaluation, and the importance of remaining in the ED until their evaluation is complete. ? ? ?  ?Theron Arista, PA-C ?07/16/21 2215 ? ?

## 2021-07-17 NOTE — Discharge Instructions (Addendum)
Take Aleve every 8 hours for the pain and inflammation.  Ice your foot, keep elevated.  Follow-up with orthopedic if your pain is not improved by Monday. ?

## 2021-07-17 NOTE — ED Provider Notes (Signed)
?[facility] EMERGENCY DEPARTMENT ?Provider Note ? ? ?CSN: [id number] ?Arrival date & time: 07/16/21  2046 ? ?  ? ?History ? ?Chief Complaint  ?Patient presents with  ? Ankle Pain  ? ? ?Phillip Flores is a 31 y.o. male. ? ? ?Ankle Pain ? ?Patient with noncontributory medical history presents today due to right calcaneal pain.  Started acutely 3 days ago, he denies noticing any inciting event.  States the pain is worse with ambulation, he feels it mostly across his heel.  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  No fevers at home, no history of gout.  Denies any history of diabetes. ? ?Home Medications ?Prior to Admission medications   ?Medication Sig Start Date End Date Taking? Authorizing Provider  ?diphenhydrAMINE (BENADRYL) 25 mg capsule Take 1 capsule (25 mg total) by mouth every 8 (eight) hours as needed. ?Patient not taking: Reported on 05/19/2018 10/29/14   Earley Favor, NP  ?HYDROcodone-acetaminophen (NORCO/VICODIN) 5-325 MG tablet Take 1 tablet by mouth every 6 (six) hours as needed for severe pain. 02/20/21   Gailen Shelter, PA  ?ibuprofen (ADVIL,MOTRIN) 600 MG tablet Take 1 tablet (600 mg total) by mouth every 6 (six) hours as needed. 05/19/18   Dartha Lodge, PA-C  ?naproxen (NAPROSYN) 500 MG tablet Take 1 tablet (500 mg total) by mouth 2 (two) times daily. 01/31/21   Vanetta Mulders, MD  ?ondansetron (ZOFRAN-ODT) 4 MG disintegrating tablet Take 1 tablet (4 mg total) by mouth every 8 (eight) hours as needed for nausea or vomiting. 02/20/21   Gailen Shelter, PA  ?predniSONE (STERAPRED UNI-PAK 21 TAB) 10 MG (21) TBPK tablet Take 1 tablet (10 mg total) by mouth daily. Take 6 tabs by mouth daily  for 2 days, then 5 tabs for 2 days, then 4 tabs for 2 days, then 3 tabs for 2 days, 2 tabs for 2 days, then 1 tab by mouth daily for 2 days ?Patient not taking: Reported on 05/19/2018 10/29/14   Earley Favor, NP   ?pseudoephedrine (SUDAFED) 30 MG tablet Take 30 mg by mouth every 8 (eight) hours as needed for congestion.    [provider]  ?tamsulosin (FLOMAX) 0.4 MG CAPS capsule Take 1 capsule (0.4 mg total) by mouth daily after supper. 02/20/21   Gailen Shelter, PA  ?   ? ?Allergies    ?Patient has no known allergies.   ? ?Review of Systems   ?Review of Systems ? ?Physical Exam ?Updated Vital Signs ?BP (!) 137/91 (BP Location: Right Arm)   Pulse 67   Temp 99.2 ?F (37.3 ?C) (Oral)   Resp 20   SpO2 97%  ?Physical Exam ?Vitals and nursing note reviewed. Exam conducted with a chaperone present.  ?Constitutional:   ?   General: He is not in acute distress. ?   Appearance: Normal appearance.  ?HENT:  ?   Head: Normocephalic and atraumatic.  ?Eyes:  ?   General: No scleral icterus. ?   Extraocular Movements: Extraocular movements intact.  ?   Pupils: Pupils are equal, round, and reactive to light.  ?Cardiovascular:  ?   Pulses: Normal pulses.  ?Musculoskeletal:     ?   General: Tenderness present. Normal range of motion.  ?   Comments: Plantarflexion dorsiflexion 5/5.  Tenderness over the calcaneus with palpation but no obvious erythema or contusions.  ROM fully intact to  ankle and knee.  ?Skin: ?   Capillary Refill: Capillary refill takes less than 2 seconds.  ?   Coloration: Skin is not jaundiced.  ?Neurological:  ?   Mental Status: He is alert. Mental status is at baseline.  ?   Coordination: Coordination normal.  ?   Comments: Sensation to light touch is grossly intact, Achilles reflexes present.  Negative Thompson sign.  ? ? ?ED Results / Procedures / Treatments   ?Labs ?(all labs ordered are listed, but only abnormal results are displayed) ?Labs Reviewed - No data to display ? ?EKG ?None ? ?Radiology ?DG Foot Complete Right ? ?Result Date: 07/16/2021 ?CLINICAL DATA:  Pain. EXAM: RIGHT FOOT COMPLETE - 3+ VIEW COMPARISON:  None Available. FINDINGS: There is no evidence of fracture or dislocation. There is no  evidence of arthropathy or other focal bone abnormality. Soft tissues are unremarkable. IMPRESSION: Negative. Electronically Signed   By: Darliss Cheney M.D.   On: 07/16/2021 22:55   ? ?Procedures ?Procedures  ? ? ?Medications Ordered in ED ?Medications - No data to display ? ?ED Course/ Medical Decision Making/ A&P ?  ?                        ?Medical Decision Making ?Amount and/or Complexity of Data Reviewed ?Radiology: ordered. ? ? ?This is a 31 year old male presenting today due to right calcaneal pain.  Physical exam is reassuring, he is neurovascularly intact with intact reflexes.  No physical signs of trauma, pulses 2+ DP and PT.  Brisk cap refill. ? ?I ordered and viewed the x-ray of his foot, no signs of fracture or dislocation. ? ?Considered septic joint, gout, Achilles tendon rupture, fracture, dislocation but work-up and physical exam do not support this.  I suspect likely muscle strain, Possibly Planter fasciitis but patient does not meet classic presentation given pain is not worse first in the morning. ? ?Considered additional imaging or work-up but patient is ambulatory, full ROM and neurovascular intact.  I think he is appropriate to follow-up outpatient with PCP orthopedics, provided referrals for both.  Patient was discharged in stable condition. ? ? ? ? ? ? ? ?Final Clinical Impression(s) / ED Diagnoses ?Final diagnoses:  ?Sprain of right foot, initial encounter  ? ? ?Rx / DC Orders ?ED Discharge Orders   ? ? None  ? ?  ? ? ?  ?Theron Arista, PA-C ?07/17/21 0202 ? ?  ?Pollyann Savoy, MD ?07/17/21 [phone] ? ?

## 2021-09-24 ENCOUNTER — Emergency Department (HOSPITAL_COMMUNITY): Payer: No Typology Code available for payment source

## 2021-09-24 ENCOUNTER — Emergency Department (HOSPITAL_COMMUNITY)
Admission: EM | Admit: 2021-09-24 | Discharge: 2021-09-24 | Disposition: A | Discharge status: Home / Self Care | Payer: No Typology Code available for payment source | Attending: Emergency Medicine | Admitting: Emergency Medicine

## 2021-09-24 ENCOUNTER — Encounter (HOSPITAL_COMMUNITY): Payer: Self-pay | Admitting: Emergency Medicine

## 2021-09-24 DIAGNOSIS — R1030 Lower abdominal pain, unspecified: Secondary | ICD-10-CM | POA: Insufficient documentation

## 2021-09-24 DIAGNOSIS — R0789 Other chest pain: Secondary | ICD-10-CM | POA: Diagnosis present

## 2021-09-24 LAB — URINALYSIS, ROUTINE W REFLEX MICROSCOPIC
Bilirubin Urine: NEGATIVE
Glucose, UA: NEGATIVE mg/dL
Hgb urine dipstick: NEGATIVE
Ketones, ur: NEGATIVE mg/dL
Leukocytes,Ua: NEGATIVE
Nitrite: NEGATIVE
Protein, ur: NEGATIVE mg/dL
Specific Gravity, Urine: 1.02 (ref 1.005–1.030)
pH: 5 (ref 5.0–8.0)

## 2021-09-24 MED ORDER — IBUPROFEN 400 MG PO TABS
600.0000 mg | ORAL_TABLET | Freq: Once | ORAL | Status: AC
  Administered 2021-09-24: 600 mg via ORAL
  Filled 2021-09-24: qty 1
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
## 2021-09-24 NOTE — Discharge Instructions (Signed)
Your chest xray today was very reassuring. If you develop worsening symptoms such as worsening abdominal pain, nausea, vomiting, blood in urine or stool please return to the ED for further evaluation.  In the meanwhile, you can take ibuprofen and tylenol for your pain.
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
## 2021-10-10 ENCOUNTER — Other Ambulatory Visit: Payer: Self-pay

## 2021-10-10 ENCOUNTER — Emergency Department (HOSPITAL_COMMUNITY)
Admission: EM | Admit: 2021-10-10 | Discharge: 2021-10-10 | Disposition: A | Discharge status: Home / Self Care | Payer: Self-pay | Attending: Emergency Medicine | Admitting: Emergency Medicine

## 2021-10-10 DIAGNOSIS — H60502 Unspecified acute noninfective otitis externa, left ear: Secondary | ICD-10-CM | POA: Insufficient documentation

## 2021-10-10 MED ORDER — CIPROFLOXACIN-DEXAMETHASONE 0.3-0.1 % OT SUSP
4.0000 [drp] | Freq: Once | OTIC | Status: AC
  Administered 2021-10-10: 4 [drp] via OTIC
  Filled 2021-10-10: qty 7.5

## 2021-10-10 NOTE — ED Provider Notes (Signed)
[facility] EMERGENCY DEPARTMENT Provider Note   CSN: [id number] Arrival date & time: 10/10/21  7893     History  Chief Complaint  Patient presents with   Otalgia    Phillip Flores is a 31 y.o. male.  Phillip Flores is a 31 y.o. male who is otherwise healthy, presents to the ED for evaluation of left ear pain.  Pain has been present for 4 days.  He reports it was initially more mild but has become more persistent.  He has had some intermittent ringing in the area and reports that his hearing is still intact but is more muffled.  He has also had some clear drainage from the ear.  No pain behind the ear.  No associated fevers or chills.  No nasal congestion or rhinorrhea.  Patient reports that he typically cleans his ears out routinely with Q-tips but has not done it over the past few days due to the ear pain.  No similar symptoms in the right ear.  No history of ear infections that he knows of, no recent swimming.   Otalgia Associated symptoms: ear discharge        Home Medications Prior to Admission medications   Medication Sig Start Date End Date Taking? Authorizing Provider  diphenhydrAMINE (BENADRYL) 25 mg capsule Take 1 capsule (25 mg total) by mouth every 8 (eight) hours as needed. Patient not taking: Reported on 05/19/2018 10/29/14   Earley Favor, NP  HYDROcodone-acetaminophen (NORCO/VICODIN) 5-325 MG tablet Take 1 tablet by mouth every 6 (six) hours as needed for severe pain. 02/20/21   Gailen Shelter, PA  ibuprofen (ADVIL,MOTRIN) 600 MG tablet Take 1 tablet (600 mg total) by mouth every 6 (six) hours as needed. 05/19/18   Dartha Lodge, PA-C  naproxen (NAPROSYN) 500 MG tablet Take 1 tablet (500 mg total) by mouth 2 (two) times daily. 01/31/21   Vanetta Mulders, MD  ondansetron (ZOFRAN-ODT) 4 MG disintegrating tablet Take 1 tablet (4 mg total) by mouth every 8 (eight) hours as needed for nausea or vomiting. 02/20/21   Gailen Shelter, PA  predniSONE (STERAPRED  UNI-PAK 21 TAB) 10 MG (21) TBPK tablet Take 1 tablet (10 mg total) by mouth daily. Take 6 tabs by mouth daily  for 2 days, then 5 tabs for 2 days, then 4 tabs for 2 days, then 3 tabs for 2 days, 2 tabs for 2 days, then 1 tab by mouth daily for 2 days Patient not taking: Reported on 05/19/2018 10/29/14   Earley Favor, NP  pseudoephedrine (SUDAFED) 30 MG tablet Take 30 mg by mouth every 8 (eight) hours as needed for congestion.    [provider]  tamsulosin (FLOMAX) 0.4 MG CAPS capsule Take 1 capsule (0.4 mg total) by mouth daily after supper. 02/20/21   Gailen Shelter, PA      Allergies    Patient has no known allergies.    Review of Systems   Review of Systems  HENT:  Positive for ear discharge and ear pain.     Physical Exam Updated Vital Signs BP (!) 167/147 (BP Location: Right Arm)   Pulse 88   Temp 98 F (36.7 C) (Oral)   Resp 18   SpO2 97%  Physical Exam Vitals and nursing note reviewed.  Constitutional:      General: He is not in acute distress.    Appearance: Normal appearance. He is well-developed. He is not ill-appearing or diaphoretic.  HENT:     Head:  Normocephalic and atraumatic.     Right Ear: Tympanic membrane and ear canal normal.     Ears:     Comments: Left external auditory canal is edematous and erythematous, limited visualization of the TM, but it is intact start without effusion, erythema or bulging Eyes:     General:        Right eye: No discharge.        Left eye: No discharge.  Pulmonary:     Effort: Pulmonary effort is normal. No respiratory distress.  Neurological:     Mental Status: He is alert and oriented to person, place, and time.     Coordination: Coordination normal.  Psychiatric:        Mood and Affect: Mood normal.        Behavior: Behavior normal.     ED Results / Procedures / Treatments   Labs (all labs ordered are listed, but only abnormal results are displayed) Labs Reviewed - No data to  display  EKG None  Radiology No results found.  Procedures Procedures    Medications Ordered in ED Medications  ciprofloxacin-dexamethasone (CIPRODEX) 0.3-0.1 % OTIC (EAR) suspension 4 drop (4 drops Left EAR Given 10/10/21 1005)    ED Course/ Medical Decision Making/ A&P                           Medical Decision Making  Otitis externa  Pt presenting with left ear pain, exam consistent with otitis externa. No canal occlusion, Pt afebrile in NAD. Exam non concerning for mastoiditis, cellulitis or malignant OE.  Patient provided Ciprodex drops in the ED.  Advised PCP follow up in 2-3 days if no improvement with treatment or no complete resolution by 7 days. Earlier f-u if patient develops rash , allergic reaction to medication, or loss of hearing.         Final Clinical Impression(s) / ED Diagnoses Final diagnoses:  Acute otitis externa of left ear, unspecified type    Rx / DC Orders ED Discharge Orders     None         Dartha Lodge, Cordelia Poche 10/10/21 1246    Virgina Norfolk, DO 10/10/21 1527

## 2021-10-10 NOTE — Discharge Instructions (Signed)
Use antibiotic eyedrops 4 drops in the left ear twice daily for the next 7 days.  Follow-up with your primary care doctor if ear pain and symptoms are not improving.

## 2021-10-10 NOTE — ED Triage Notes (Signed)
Pt with L ear pain since Wednesday, worsening yesterday while at work with associated ringing in his ear.
